# Patient Record
Sex: Male | Born: 1990
Health system: Southern US, Community
[De-identification: ages and names within clinical notes are randomized; demographics above are authoritative.]

## PROBLEM LIST (undated history)

## (undated) DIAGNOSIS — J45909 Unspecified asthma, uncomplicated: Secondary | ICD-10-CM

## (undated) DIAGNOSIS — E669 Obesity, unspecified: Secondary | ICD-10-CM

## (undated) DIAGNOSIS — I10 Essential (primary) hypertension: Secondary | ICD-10-CM

## (undated) DIAGNOSIS — N529 Male erectile dysfunction, unspecified: Secondary | ICD-10-CM

## (undated) DIAGNOSIS — I517 Cardiomegaly: Secondary | ICD-10-CM

## (undated) HISTORY — DX: Essential (primary) hypertension: I10

## (undated) HISTORY — PX: PENILE DEBRIDEMENT: SHX2200

## (undated) HISTORY — DX: Obesity, unspecified: E66.9

## (undated) HISTORY — DX: Cardiomegaly: I51.7

## (undated) HISTORY — DX: Male erectile dysfunction, unspecified: N52.9

---
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
## 2011-08-15 LAB — STREP A DNA PROBE: Group A Strep Probe: NEGATIVE

## 2011-08-15 LAB — RAPID STREP SCREEN (MED CTR MEBANE ONLY): Streptococcus, Group A Screen (Direct): NEGATIVE

## 2013-09-27 ENCOUNTER — Ambulatory Visit: Payer: Self-pay | Admitting: Family Medicine

## 2015-10-28 ENCOUNTER — Encounter (HOSPITAL_COMMUNITY): Payer: Self-pay | Admitting: Emergency Medicine

## 2015-10-28 ENCOUNTER — Emergency Department (HOSPITAL_COMMUNITY): Payer: Self-pay

## 2015-10-28 ENCOUNTER — Emergency Department (HOSPITAL_COMMUNITY)
Admission: EM | Admit: 2015-10-28 | Discharge: 2015-10-28 | Disposition: A | Discharge status: Home / Self Care | Payer: Self-pay | Attending: Emergency Medicine | Admitting: Emergency Medicine

## 2015-10-28 ENCOUNTER — Emergency Department (HOSPITAL_COMMUNITY): Admission: EM | Admit: 2015-10-28 | Discharge: 2015-10-28 | Discharge status: Absent without leave | Payer: Self-pay

## 2015-10-28 DIAGNOSIS — J45909 Unspecified asthma, uncomplicated: Secondary | ICD-10-CM | POA: Insufficient documentation

## 2015-10-28 DIAGNOSIS — R0789 Other chest pain: Secondary | ICD-10-CM | POA: Insufficient documentation

## 2015-10-28 HISTORY — DX: Unspecified asthma, uncomplicated: J45.909

## 2015-10-28 MED ORDER — IBUPROFEN 800 MG PO TABS
800.0000 mg | ORAL_TABLET | Freq: Once | ORAL | Status: AC
  Administered 2015-10-28: 800 mg via ORAL
  Filled 2015-10-28: qty 1

## 2015-10-28 MED ORDER — IBUPROFEN 800 MG PO TABS: 800.0000 mg | ORAL_TABLET | Freq: Three times a day (TID) | ORAL | Status: DC

## 2015-10-28 NOTE — ED Provider Notes (Signed)
CSN: [id number]     Arrival date & time 10/28/15  1900 History   First MD Initiated Contact with Patient 10/28/15 1909     Chief Complaint  Patient presents with  . Chest Pain     (Consider location/radiation/quality/duration/timing/severity/associated sxs/prior Treatment) HPI Comments: The pt is a 24 y/o male No PMH' No meds Hx of 60 lb weight loss over the last year with exercise - daily running on treadmill Today while sitting at rest had sharp stabbing CP on the mid and L side of the Chest - is constdant, worse with movement but not exertion / position or deep breathing - has no cough / fever or other c/o - no swelling of legs, travel, trauma or immob.  No PE or ACS rf's.  Mild sx at this time - gradually improving.  Patient is a 24 y.o. male presenting with chest pain. The history is provided by the patient.  Chest Pain   Past Medical History  Diagnosis Date  . Asthma    History reviewed. No pertinent past surgical history. History reviewed. No pertinent family history. Social History  Substance Use Topics  . Smoking status: Never Smoker   . Smokeless tobacco: None  . Alcohol Use: No    Review of Systems  Cardiovascular: Positive for chest pain.  All other systems reviewed and are negative.     Allergies  Review of patient's allergies indicates no known allergies.  Home Medications   Prior to Admission medications   Medication Sig Start Date End Date Taking? Authorizing Provider  ibuprofen (ADVIL,MOTRIN) 800 MG tablet Take 1 tablet (800 mg total) by mouth 3 (three) times daily. 10/28/15   Eber Hong, MD   BP 133/84 mmHg  Pulse 70  Temp(Src) 98.1 F (36.7 C) (Oral)  Resp 20  Ht  (1.753 m)  Wt 220 lb (99.791 kg)  BMI 32.47 kg/m2  SpO2 100% Physical Exam  Constitutional: He appears well-developed and well-nourished. No distress.  HENT:  Head: Normocephalic and atraumatic.  Mouth/Throat: Oropharynx is clear and moist. No oropharyngeal exudate.   Eyes: Conjunctivae and EOM are normal. Pupils are equal, round, and reactive to light. Right eye exhibits no discharge. Left eye exhibits no discharge. No scleral icterus.  Neck: Normal range of motion. Neck supple. No JVD present. No thyromegaly present.  Cardiovascular: Normal rate, regular rhythm, normal heart sounds and intact distal pulses.  Exam reveals no gallop and no friction rub.   No murmur heard. Pulmonary/Chest: Effort normal and breath sounds normal. No respiratory distress. He has no wheezes. He has no rales. He exhibits no tenderness.  Abdominal: Soft. Bowel sounds are normal. He exhibits no distension and no mass. There is no tenderness.  Musculoskeletal: Normal range of motion. He exhibits no edema or tenderness.  Lymphadenopathy:    He has no cervical adenopathy.  Neurological: He is alert. Coordination normal.  Skin: Skin is warm and dry. No rash noted. No erythema.  Psychiatric: He has a normal mood and affect. His behavior is normal.  Nursing note and vitals reviewed.   ED Course  Procedures (including critical care time) Labs Review Labs Reviewed - No data to display  Imaging Review No results found. I have personally reviewed and evaluated these images and lab results as part of my medical decision-making.   EKG Interpretation   Date/Time:  Wednesday October 28 2015 19:09:11 EST Ventricular Rate:  71 PR Interval:  152 QRS Duration: 121 QT Interval:  428 QTC Calculation: 465 R  Axis:   -37 Text Interpretation:  Sinus rhythm Nonspecific IVCD with LAD ST elev,  probable normal early repol pattern No old tracing to compare Confirmed by  Jeferson Boozer  MD, Tennie Grussing ([id number]) on 10/28/2015 7:22:50 PM      MDM   Final diagnoses:  Chest wall pain    ECG unremarkable - CXR pending - low risk ACS or PE - liekly pleurisy - motrin,  Pt refuses CXR - leaving AMA - pt informed of need for xray to r/o other pathology =- states he doesn't have money for xray and refuses-   Well appaering.  Meds given in ED:  Medications - No data to display  New Prescriptions   IBUPROFEN (ADVIL,MOTRIN) 800 MG TABLET    Take 1 tablet (800 mg total) by mouth 3 (three) times daily.        Eber HongBrian Caulder Wehner, MD 10/28/15 2027

## 2015-10-28 NOTE — Discharge Instructions (Signed)

## 2015-10-28 NOTE — ED Notes (Signed)
Pt states his pain is better and wants to leave. Pt does not want to stay. I tried to talk pt into staying but he denied.

## 2015-10-28 NOTE — ED Notes (Signed)
Patient complaining of central chest pain. States started approximately an hour and a half ago. Also reports shortness of breath. Denies other symptoms. Patient states he has been working out a lot recently.

## 2016-06-28 ENCOUNTER — Ambulatory Visit (INDEPENDENT_AMBULATORY_CARE_PROVIDER_SITE_OTHER): Payer: BLUE CROSS/BLUE SHIELD | Admitting: Urology

## 2016-06-28 DIAGNOSIS — N529 Male erectile dysfunction, unspecified: Secondary | ICD-10-CM

## 2016-07-20 ENCOUNTER — Ambulatory Visit (INDEPENDENT_AMBULATORY_CARE_PROVIDER_SITE_OTHER): Payer: BLUE CROSS/BLUE SHIELD | Admitting: Urology

## 2016-07-20 ENCOUNTER — Ambulatory Visit: Payer: BLUE CROSS/BLUE SHIELD | Admitting: Urology

## 2016-07-20 DIAGNOSIS — N5201 Erectile dysfunction due to arterial insufficiency: Secondary | ICD-10-CM | POA: Diagnosis not present

## 2016-07-20 DIAGNOSIS — N529 Male erectile dysfunction, unspecified: Secondary | ICD-10-CM

## 2016-08-31 ENCOUNTER — Ambulatory Visit (INDEPENDENT_AMBULATORY_CARE_PROVIDER_SITE_OTHER): Payer: BLUE CROSS/BLUE SHIELD | Admitting: Urology

## 2016-08-31 DIAGNOSIS — N529 Male erectile dysfunction, unspecified: Secondary | ICD-10-CM

## 2016-08-31 DIAGNOSIS — N5201 Erectile dysfunction due to arterial insufficiency: Secondary | ICD-10-CM | POA: Diagnosis not present

## 2016-11-30 ENCOUNTER — Ambulatory Visit: Payer: BLUE CROSS/BLUE SHIELD | Admitting: Urology

## 2018-07-26 DIAGNOSIS — J019 Acute sinusitis, unspecified: Secondary | ICD-10-CM | POA: Diagnosis not present

## 2018-07-26 DIAGNOSIS — R5383 Other fatigue: Secondary | ICD-10-CM | POA: Diagnosis not present

## 2018-08-15 DIAGNOSIS — J019 Acute sinusitis, unspecified: Secondary | ICD-10-CM | POA: Diagnosis not present

## 2018-08-15 DIAGNOSIS — R030 Elevated blood-pressure reading, without diagnosis of hypertension: Secondary | ICD-10-CM | POA: Diagnosis not present

## 2018-09-14 DIAGNOSIS — N529 Male erectile dysfunction, unspecified: Secondary | ICD-10-CM | POA: Diagnosis not present

## 2018-09-14 DIAGNOSIS — I10 Essential (primary) hypertension: Secondary | ICD-10-CM

## 2018-09-14 DIAGNOSIS — E669 Obesity, unspecified: Secondary | ICD-10-CM | POA: Diagnosis not present

## 2018-09-14 DIAGNOSIS — R740 Nonspecific elevation of levels of transaminase and lactic acid dehydrogenase [LDH]: Secondary | ICD-10-CM | POA: Diagnosis not present

## 2018-09-14 DIAGNOSIS — I160 Hypertensive urgency: Secondary | ICD-10-CM | POA: Diagnosis not present

## 2018-09-14 DIAGNOSIS — R079 Chest pain, unspecified: Secondary | ICD-10-CM | POA: Diagnosis not present

## 2018-09-14 DIAGNOSIS — I517 Cardiomegaly: Secondary | ICD-10-CM | POA: Diagnosis not present

## 2018-09-14 HISTORY — DX: Essential (primary) hypertension: I10

## 2018-09-15 DIAGNOSIS — I160 Hypertensive urgency: Secondary | ICD-10-CM | POA: Diagnosis not present

## 2018-09-15 DIAGNOSIS — Z634 Disappearance and death of family member: Secondary | ICD-10-CM | POA: Diagnosis not present

## 2018-09-16 DIAGNOSIS — R079 Chest pain, unspecified: Secondary | ICD-10-CM | POA: Diagnosis not present

## 2018-09-19 ENCOUNTER — Ambulatory Visit: Payer: 59 | Admitting: Medical

## 2018-09-19 ENCOUNTER — Encounter: Payer: Self-pay | Admitting: Medical

## 2018-09-19 VITALS — BP 140/94 | HR 82 | Temp 98.1°F | Resp 16 | Ht 68.5 in | Wt 259.4 lb

## 2018-09-19 DIAGNOSIS — R7989 Other specified abnormal findings of blood chemistry: Secondary | ICD-10-CM | POA: Diagnosis not present

## 2018-09-19 DIAGNOSIS — F419 Anxiety disorder, unspecified: Secondary | ICD-10-CM | POA: Diagnosis not present

## 2018-09-19 DIAGNOSIS — I517 Cardiomegaly: Secondary | ICD-10-CM

## 2018-09-19 DIAGNOSIS — E669 Obesity, unspecified: Secondary | ICD-10-CM

## 2018-09-19 DIAGNOSIS — Z6838 Body mass index (BMI) 38.0-38.9, adult: Secondary | ICD-10-CM | POA: Insufficient documentation

## 2018-09-19 DIAGNOSIS — I10 Essential (primary) hypertension: Secondary | ICD-10-CM | POA: Diagnosis not present

## 2018-09-19 DIAGNOSIS — R778 Other specified abnormalities of plasma proteins: Secondary | ICD-10-CM

## 2018-09-19 DIAGNOSIS — R079 Chest pain, unspecified: Secondary | ICD-10-CM | POA: Insufficient documentation

## 2018-09-19 DIAGNOSIS — I119 Hypertensive heart disease without heart failure: Secondary | ICD-10-CM | POA: Insufficient documentation

## 2018-09-19 MED ORDER — CITALOPRAM HYDROBROMIDE 10 MG PO TABS: 10.0000 mg | ORAL_TABLET | Freq: Every day | ORAL | 1 refills | Status: DC

## 2018-09-19 NOTE — Progress Notes (Signed)
Subjective: Chief Complaint  Patient presents with  . NP    NP hospital fu chest pain    Here as new patient for hospital f/u.   Went 09/14/18 to [facility] in [place], [place] Friday for chest pain.   Was kept overnight for observation.     Was at work this past Friday 6 days ago, had chest pain that wouldn't resolve.  He drove himself to the ED and was evaluated.  He reports that blood levels were elevated and there was some other abnormalities noted.  He denies associated SOB, sweats or nausea during the chest pain episode.   The chest pain lasted an hour or more that day.     I reviewed hospital records in care everywhere from [facility]. Comprehensive metabolic panel normal, serial troponin's were elevated on the second third test, 0.033. Urinalysis normal Urine drug screen negative Blood count was minimal abnormalities not likely significant Chest x-ray showed cardiomegaly  He was started on amlodipine 5 mg daily, chlorthalidone 25 mg daily  Lives alone.  He is aware that he snores.  No hx/o reported apnea.   Father had sleep apnea.   Sometimes awakes rested.   Does get sleepy sometimes during the day.  He reports that he exercises regularly roughly 30 minutes most days a week on the treadmill or elliptical machine, does some weight training.  He denies symptoms during exercise.  He notes considerable anxiety and worry.  His father passed away right at a year ago unexpectedly from a brain infection.  His father had diabetes.  His mother has high blood pressure.  He is not aware of any heart disease or stroke in the family otherwise.  He would like to go back on some medicine to help with anxiety.  He has been on some medication in the remote past for this.  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   Past Medical History:  Diagnosis Date  . Asthma    Current Outpatient Medications on File Prior to Visit   Medication Sig Dispense Refill  . amLODipine (NORVASC) 5 MG tablet Take 5 mg by mouth daily.    . chlorthalidone (HYGROTON) 25 MG tablet Take 25 mg by mouth daily.     No current facility-administered medications on file prior to visit.    ROS as in subjective'   Objective: BP (!) 140/94   Pulse 82   Temp 98.1 F (36.7 C) (Oral)   Resp 16   Ht 5' 8.5" (1.74 m)   Wt 259 lb 6.4 oz (117.7 kg)   SpO2 98%   BMI 38.87 kg/m   General appearance: alert, no distress, WD/WN, obese AA male HEENT: normocephalic, sclerae anicteric, TMs pearly, nares patent, no discharge or erythema, pharynx normal Oral cavity: MMM, no lesions Neck: supple, no lymphadenopathy, no thyromegaly, no masses, no JVD Heart: RRR, normal S1, S2, no murmurs Lungs: CTA bilaterally, no wheezes, rhonchi, or rales Ext: no edema Pulses: 2+ symmetric, upper and lower extremities, normal cap refill    Assessment: Encounter Diagnoses  Name Primary?  . Essential hypertension, benign Yes  . Elevated troponin   . Cardiomegaly   . Anxiety   . Obesity with serious comorbidity, unspecified classification, unspecified obesity type   . Chest pain, unspecified type      Plan: Hypertension-new diagnosis last week, continue current 2 medications amlodipine and chlorthalidone.  Chest pain-I reviewed his care everywhere emergency department notes, labs, chest  x-ray and EKG.  Given the abnormal troponin and chest x-ray findings we will refer to cardiology.  Advised he only walk and no other strenuous exercise including weight training for the time being.  Discussed symptoms and signs that would prompt a call to 911.  Obesity-counseled on making some changes with lifestyle particular diet to help with weight loss gradually  Anxiety- advised counseling, advised to surround himself with people he can encourage him and be listening year.  He wants to begin some medicine to help so we will start on low-dose citalopram once daily  at bedtime.  Discussed risk and benefits of medication.  Follow-up in 3 weeks, sooner as needed   Damante was seen today for np.  Diagnoses and all orders for this visit:  Essential hypertension, benign -     Ambulatory referral to Cardiology  Elevated troponin -     Ambulatory referral to Cardiology  Cardiomegaly -     Ambulatory referral to Cardiology  Anxiety  Obesity with serious comorbidity, unspecified classification, unspecified obesity type  Chest pain, unspecified type  Other orders -     citalopram (CELEXA) 10 MG tablet; Take 1 tablet (10 mg total) by mouth daily.

## 2018-09-19 NOTE — Patient Instructions (Signed)
It was nice to meet you.  We look forward to working with you on your healthcare concerns  Recommendations  I am referring you to cardiology hopefully within the next 7 days for additional evaluation given your findings at the emergency department  Continue the 2 new blood pressure medications amlodipine and chlorthalidone once daily in the morning  Begin citalopram/Celexa to help with anxiety and mood  Consider counseling  Until you see the cardiologist do not do any heavy lifting or weight training, keep your cardiac activity to walking and no strenuous activity for now  Consider diet information below  I recommend a healthy diet.    Do's:   whole grains such as whole grain pasta, rice, whole grains breads and whole grain cereals.  Use small quantities such as 1/2 cup per serving or 2 slices of bread per serving.    Eat 3-5 fruits daily  Eat beans at least once daily  Eat almonds in small quantities at least 3 days per week    If they eat meat, I recommend small portions of lean meats such as chicken, fish, and [place].  Eat as much NON corn and NON potato vegetables as they like, particularly raw or steamed  Drink several large glasses of water daily  Cautions:  Limit red meat  Limit corn and potatoes  Limit sweets, cake, pie, candy  Limit beer and alcohol  Avoid fried food, fast food, large portions  Avoid sugary drinks such as regular soda and sweet tea    RESOURCES in [place], [place]  If you are experiencing a mental health crisis or an emergency, please call 911 or go to the nearest emergency department.  [facility]   [phone] [facility]  [phone] [facility]   [phone]  Suicide Hotline 1-800-Suicide 909-211-9146)  National Suicide Prevention Lifeline (762) 133-7822  202 051 3242)  Domestic Violence, Rape/Crisis - [facility] [place] 932-355-7322  [employer] Violence Hotline  1-800-799-SAFE (949)430-4477)  To report Child or Elder Abuse, please call: [facility] Police Department  [phone] [facility] Department  [phone]  [facility]  Crisis Line [phone]  Teen Crisis line [phone] or [phone]     Psychiatry and Counseling services  [facility] [address], [place], [place] [address] [phone]  Stevphen Meuse, therapist Dr. Meredith Staggers, psychiatrist Dr. Beverly Milch, child psychiatrist   Dr. Len Blalock [address], [place], [place] [address] [phone]   Dr. Milagros Evener, psychiatry [address] [place], [place] [address] [phone]   [facility] [address] [place], [place] [address] [phone]   [facility] [address] [place], [place], [place] [address] [phone]    Counseling Services (NON- psychiatrist offices)  [facility] [address] [place], [place] [address] [phone]   [facility] [phone] [address], [place], [place] [address]   [facility] [phone]  [email or web address] [address] [place], [place], [place] [address]   Lenise Arena. Charlyne Mom, therapist [phone] [address] [place], [place] [address]   [facility] [phone] [address] [place], [place] [address]   Glade Lloyd, therapist [phone] [address], [place], [place] [address]   [facility] [phone] [address] [place], [place], [place] [address]

## 2018-10-02 ENCOUNTER — Telehealth: Payer: Self-pay | Admitting: Medical

## 2018-10-02 NOTE — Telephone Encounter (Signed)
Please call   He has not heard from cardiology referral

## 2018-10-03 NOTE — Telephone Encounter (Signed)
Pt was notified and was given number to Dr. Jacinto HalimGanji to call to schedule

## 2018-10-08 ENCOUNTER — Encounter: Payer: Self-pay | Admitting: Medical

## 2018-10-08 ENCOUNTER — Ambulatory Visit: Payer: 59 | Admitting: Medical

## 2018-10-08 VITALS — BP 130/74 | HR 73 | Temp 98.7°F | Resp 16 | Ht 69.0 in | Wt 262.0 lb

## 2018-10-08 DIAGNOSIS — I517 Cardiomegaly: Secondary | ICD-10-CM | POA: Diagnosis not present

## 2018-10-08 DIAGNOSIS — F419 Anxiety disorder, unspecified: Secondary | ICD-10-CM | POA: Diagnosis not present

## 2018-10-08 DIAGNOSIS — E669 Obesity, unspecified: Secondary | ICD-10-CM

## 2018-10-08 DIAGNOSIS — I10 Essential (primary) hypertension: Secondary | ICD-10-CM | POA: Diagnosis not present

## 2018-10-08 DIAGNOSIS — R7989 Other specified abnormal findings of blood chemistry: Secondary | ICD-10-CM

## 2018-10-08 DIAGNOSIS — R778 Other specified abnormalities of plasma proteins: Secondary | ICD-10-CM

## 2018-10-08 MED ORDER — AMLODIPINE BESYLATE 5 MG PO TABS: 5.0000 mg | ORAL_TABLET | Freq: Every day | ORAL | 2 refills | Status: DC

## 2018-10-08 MED ORDER — CITALOPRAM HYDROBROMIDE 20 MG PO TABS: 20.0000 mg | ORAL_TABLET | Freq: Every day | ORAL | 2 refills | Status: DC

## 2018-10-08 MED ORDER — CHLORTHALIDONE 25 MG PO TABS: 25.0000 mg | ORAL_TABLET | Freq: Every day | ORAL | 2 refills | Status: DC

## 2018-10-08 NOTE — Progress Notes (Signed)
Subjective: Chief Complaint  Patient presents with  . follow up    follow up HTN  cardiology next monday   Here for follow-up on hypertension, last visit new patient visit  Last visit we advised to continue amlodipine 5 mg daily and chlorthalidone 25 mg daily started by the emergency department in [facility] [place].  He was seen on 09/14/2018 for chest pain and elevated troponins.  We referred him to cardiology and he has his appointment a week from today.Marland Kitchen. No cramping, no side effects reported. He has been walking for exercise and elliptical.   Has main some small changes in diet since last visit.  He knows there are areas he can improve on.  He is compliant with the 2 blood pressure medications.  Last visit we also started him on Celexa to help with anxiety.  He feels that the Celexa is helping some.  No side effects.  Taking it at night.  Last visit he noted general problems with anxiety including stress with father's passing away this past year.   Stays busy on the weekends, and busy with work during the week.   Working on [employer].  Worries about mother's legal troubles.   She has drinking problem.  Lives alone.  No current significant other.   Has seen counselor in the past.   Since last visit has had some slight chest pains, not like what prompted his initial ED visit 09/14/18.  Past Medical History:  Diagnosis Date  . Asthma   . Hypertension 09/2018    Family History  Problem Relation Age of Onset  . Hypertension Mother   . Diabetes Father   . Other Father        died of brain infection  . Heart disease Neg Hx   . Stroke Neg Hx    ROS as in subjective   Objective: BP 130/74   Pulse 73   Temp 98.7 F (37.1 C) (Oral)   Resp 16   Ht 5\' 9"  (1.753 m)   Wt 262 lb (118.8 kg)   SpO2 97%   BMI 38.69 kg/m   Wt Readings from Last 3 Encounters:  10/08/18 262 lb (118.8 kg)  09/19/18 259 lb 6.4 oz (117.7 kg)  10/28/15 220 lb (99.8 kg)   BP Readings from Last 3  Encounters:  10/08/18 130/74  09/19/18 (!) 140/94  10/28/15 133/84   Gen: wd, wn, nad Lungs clear Heart rrr, normal s1, s2, no murmurs Pulses normal Psych: pleasant, good eye contact, answers questions appropriately     Assessment: Encounter Diagnoses  Name Primary?  . Essential hypertension, benign Yes  . Cardiomegaly   . Anxiety   . Elevated troponin   . Obesity with serious comorbidity, unspecified classification, unspecified obesity type       Plan: Hypertension, elevated troponin, cardiomegaly on chest x-ray -improved, continue amlodipine and chlorthalidone.  He sees cardiology for new consult next week.  We will hold off on basic metabolic lab to recheck potassium and defer to them next week.  Anxiety-increase citalopram to 20 mg, recommended counseling, recommend he get in a men's group through his church.     Adam Burns was seen today for follow up.  Diagnoses and all orders for this visit:  Essential hypertension, benign  Cardiomegaly  Anxiety  Elevated troponin  Obesity with serious comorbidity, unspecified classification, unspecified obesity type  Other orders -     citalopram (CELEXA) 20 MG tablet; Take 1 tablet (20 mg total) by mouth daily. -  chlorthalidone (HYGROTON) 25 MG tablet; Take 1 tablet (25 mg total) by mouth daily. -     amLODipine (NORVASC) 5 MG tablet; Take 1 tablet (5 mg total) by mouth daily.

## 2018-10-15 DIAGNOSIS — E669 Obesity, unspecified: Secondary | ICD-10-CM | POA: Diagnosis not present

## 2018-10-15 DIAGNOSIS — I10 Essential (primary) hypertension: Secondary | ICD-10-CM | POA: Diagnosis not present

## 2018-10-15 DIAGNOSIS — I517 Cardiomegaly: Secondary | ICD-10-CM | POA: Diagnosis not present

## 2018-10-25 DIAGNOSIS — I10 Essential (primary) hypertension: Secondary | ICD-10-CM | POA: Diagnosis not present

## 2018-10-25 DIAGNOSIS — E669 Obesity, unspecified: Secondary | ICD-10-CM | POA: Diagnosis not present

## 2018-11-02 DIAGNOSIS — I119 Hypertensive heart disease without heart failure: Secondary | ICD-10-CM | POA: Diagnosis not present

## 2018-11-02 DIAGNOSIS — Z0189 Encounter for other specified special examinations: Secondary | ICD-10-CM | POA: Diagnosis not present

## 2018-11-02 DIAGNOSIS — E669 Obesity, unspecified: Secondary | ICD-10-CM | POA: Diagnosis not present

## 2018-11-06 ENCOUNTER — Encounter: Payer: Self-pay | Admitting: Medical

## 2018-11-23 DIAGNOSIS — J40 Bronchitis, not specified as acute or chronic: Secondary | ICD-10-CM | POA: Diagnosis not present

## 2018-11-23 DIAGNOSIS — J329 Chronic sinusitis, unspecified: Secondary | ICD-10-CM | POA: Diagnosis not present

## 2019-01-09 ENCOUNTER — Ambulatory Visit: Payer: 59 | Admitting: Medical

## 2019-01-09 ENCOUNTER — Encounter: Payer: Self-pay | Admitting: Medical

## 2019-01-09 VITALS — BP 130/80 | HR 64 | Temp 98.4°F | Resp 16 | Ht 69.0 in | Wt 261.8 lb

## 2019-01-09 DIAGNOSIS — E669 Obesity, unspecified: Secondary | ICD-10-CM | POA: Diagnosis not present

## 2019-01-09 DIAGNOSIS — R079 Chest pain, unspecified: Secondary | ICD-10-CM | POA: Diagnosis not present

## 2019-01-09 DIAGNOSIS — I517 Cardiomegaly: Secondary | ICD-10-CM | POA: Diagnosis not present

## 2019-01-09 DIAGNOSIS — I10 Essential (primary) hypertension: Secondary | ICD-10-CM | POA: Diagnosis not present

## 2019-01-09 DIAGNOSIS — K219 Gastro-esophageal reflux disease without esophagitis: Secondary | ICD-10-CM | POA: Insufficient documentation

## 2019-01-09 MED ORDER — OMEPRAZOLE 40 MG PO CPDR: 40.0000 mg | DELAYED_RELEASE_CAPSULE | Freq: Every day | ORAL | 1 refills | Status: DC

## 2019-01-09 MED ORDER — VALSARTAN-HYDROCHLOROTHIAZIDE 320-25 MG PO TABS: 1.0000 | ORAL_TABLET | Freq: Every day | ORAL | 3 refills | Status: DC

## 2019-01-09 NOTE — Patient Instructions (Signed)
Gastroesophageal Reflux Disease, Adult   Gastroesophageal reflux disease (GERD) happens when acid from your stomach flows up into the esophagus. When acid comes in contact with the esophagus, the acid causes soreness (inflammation) in the esophagus. Over time, GERD may create small holes (ulcers) in the lining of the esophagus.  CAUSES   Increased body weight. This puts pressure on the stomach, making acid rise from the stomach into the esophagus.   Smoking. This increases acid production in the stomach.   Drinking alcohol. This causes decreased pressure in the lower esophageal sphincter (valve or ring of muscle between the esophagus and stomach), allowing acid from the stomach into the esophagus.   Late evening meals and a full stomach. This increases pressure and acid production in the stomach.   A malformed lower esophageal sphincter.  Sometimes, no cause is found.  SYMPTOMS   Burning pain in the lower part of the mid-chest behind the breastbone and in the mid-stomach area. This may occur twice a week or more often.   Trouble swallowing.   Sore throat.   Dry cough.   Asthma-like symptoms including chest tightness, shortness of breath, or wheezing.   DIAGNOSIS  Your caregiver may be able to diagnose GERD based on your symptoms. In some cases, X-rays and other tests may be done to check for complications or to check the condition of your stomach and esophagus.   HOME CARE INSTRUCTIONS   Change the factors that you can control. Ask your caregiver for guidance concerning weight loss, quitting smoking, and alcohol consumption.   Avoid foods and drinks that make your symptoms worse, such as:   Caffeine or alcoholic drinks.   Chocolate.   Peppermint or mint flavorings.   Garlic and onions.   Spicy foods.   Citrus fruits, such as oranges, lemons, or limes.   Tomato-based foods such as sauce, chili, salsa, and pizza.   Fried and fatty foods.   Avoid lying down for  the 3 hours prior to your bedtime or prior to taking a nap.   Eat small, frequent meals instead of large meals.   Wear loose-fitting clothing. Do not wear anything tight around your waist that causes pressure on your stomach.   Raise the head of your bed 6 to 8 inches with wood blocks to help you sleep. Extra pillows will not help.   Only take over-the-counter or prescription medicines for pain, discomfort, or fever as directed by your caregiver.   Do not take aspirin, ibuprofen, or other nonsteroidal anti-inflammatory drugs (NSAIDs).   SEEK IMMEDIATE MEDICAL CARE IF:   You have pain in your arms, neck, jaw, teeth, or back.   Your pain increases or changes in intensity or duration.   You develop nausea, vomiting, or sweating (diaphoresis).   You develop shortness of breath, or you faint.   Your vomit is green, yellow, black, or looks like coffee grounds or blood.   Your stool is red, bloody, or black.  These symptoms could be signs of other problems, such as heart disease, gastric bleeding, or esophageal bleeding. MAKE SURE YOU:   Understand these instructions.   Will watch your condition.   Will get help right away if you are not doing well or get worse.  Document Released: 08/10/2005 Document Revised: 07/13/2011 Document Reviewed: 05/20/2011 [facility] Patient Information 2012 [place], [place].      Lets change strategies and try something that may work better than what you are currently doing  Breakfast You may eat 1 of  the following  Smoothie with Almond milk, handful of kale or spinach, and 1-2 fruit servings of your choice such as berries or 1/2 banana  Whole grain slice of toast and thin layer of low sugar jam or small amount of honey  Whole grain slice of toast and avocado spread  1/2 cup of steel cut oats (oatmeal)   Mid-morning snack 1 fruit serving such as one of the following:  medium-sized apple  medium-sized orange,  Tangerine  1/2 banana    3/4 cup of fresh berries or frozen berries  A protein source such as one of the following:  8 almonds   small handful of walnuts or other nuts  Hummus and vegetable such as carrots   Lunch A protein source such as 1 of the following: . 1 serving of beans such as black beans, pinto beans, green beans, or edamame (soy beans) . Veggie burger  . Non breaded fish such as salmon or tuna, either baked, grilled, or broiled Vegetable - Half of your plate should be a non-starchy vegetables!  So avoid white potatoes and corn.  Otherwise, eat a large portion of vegetables. . Avocado, cucumber, tomato, carrots, greens, lettuce, squash, okra, etc.  . Vegetables can include salad with olive oil/vinaigrette dressing Grains such as 1/2 cup of brown rice, quinoa, barley or other whole grain or 1 or 2 slices of whole grain bread   Mid-afternoon snack 1 fruit serving such as one of the following:  medium-sized apple  medium-sized orange,  Tangerine  1/2 banana   3/4 cup of fresh berries or frozen berries  A protein source such as one of the following:  8 almonds   small handful of walnuts or other nuts  Hummus and vegetable such as carrots   Dinner A protein source such as 1 of the following: . 1 serving of beans such as black beans, pinto beans, green beans, or edamame (soy beans) . Veggie burger  . Non breaded fish such as salmon or tuna, either baked, grilled, or broiled Vegetable - Half of your plate should be a non-starchy vegetables!  So avoid white potatoes and corn.  Otherwise, eat a large portion of vegetables. . Avocado, cucumber, tomato, carrots, greens, lettuce, squash, okra, etc.  . Vegetables can include salad with olive oil/vinaigrette dressing Grains such as 1/2 cup of brown rice, quinoa, barley or other whole grain or 1 or 2 slices of whole grain bread   Beverages: Water Unsweet tea Home made juice with a juicer without sugar added other than small bit of  honey or agave nectar Water with sugar free flavor such as [place]   AVOID.... For the time being I want you to cut out the following items completely: . Soda, sweet tea, juice, beer or wine or alcohol . Sweets such as cake, candy, pies, chips, cookies, chocolate

## 2019-01-09 NOTE — Progress Notes (Signed)
Subjective: Chief Complaint  Patient presents with  . follow up    follow up chest pain   Here for chest pains again.  I saw him a few months ago referred him to cardiology given abnormal findings from hospital visit for chest pain.  His blood pressure medicine has been changed.  He currently notes that he is on valsartan HCT and not amlodipine and chlorthalidone.  His chest pains have been random.  2 nights ago had some chest discomfort that lasted 1.5 to 2 hours.  It occurred after he ate some [employer] chicken.  He does endorse eating spicy and acidic foods, drinks soda regularly.  He has tried to make some improvements in diet and exercise since last visit here.  Denies sweats, SOB, dyspnea, wheezing.  He is exercising with walking, and denies chest pain with exercise.   No other aggravating or relieving factors. No other complaint.   Past Medical History:  Diagnosis Date  . Asthma   . Hypertension 09/2018   Current Outpatient Medications on File Prior to Visit  Medication Sig Dispense Refill  . amLODipine (NORVASC) 5 MG tablet Take 1 tablet (5 mg total) by mouth daily. (Patient not taking: Reported on 01/09/2019) 30 tablet 2   No current facility-administered medications on file prior to visit.    ROS as in subjective   Objective: BP 130/80   Pulse 64   Temp 98.4 F (36.9 C) (Oral)   Resp 16   Ht 5\' 9"  (1.753 m)   Wt 261 lb 12.8 oz (118.8 kg)   SpO2 98%   BMI 38.66 kg/m   Wt Readings from Last 3 Encounters:  01/09/19 261 lb 12.8 oz (118.8 kg)  10/08/18 262 lb (118.8 kg)  09/19/18 259 lb 6.4 oz (117.7 kg)   BP Readings from Last 3 Encounters:  01/09/19 130/80  10/08/18 130/74  09/19/18 (!) 140/94   General appearance: alert, no distress, WD/WN,  Neck: supple, no lymphadenopathy, no thyromegaly, no masses Heart: RRR, normal S1, S2, no murmurs Lungs: CTA bilaterally, no wheezes, rhonchi, or rales Chest wall nontender, no deformity, normal I:E Abdomen: +bs, soft, non  tender, non distended, no masses, no hepatomegaly, no splenomegaly Pulses: 2+ symmetric, upper and lower extremities, normal cap refill Ext: no edema   Assessment: Encounter Diagnoses  Name Primary?  . Chest pain, unspecified type Yes  . LVH (left ventricular hypertrophy)   . Cardiomegaly   . Essential hypertension, benign   . Obesity with serious comorbidity, unspecified classification, unspecified obesity type   . Gastroesophageal reflux disease, esophagitis presence not specified      Plan: I reviewed his cardiology notes from visit in December and echocardiogram showing severe LVH secondary to HTN and obesity.     His symptoms today suggest GERD.  Begin Omeprazole, avoid GERD trigger foods.    counseled on exercise.  Avoid weight lifting/body building, but focus more on gradual increase in aerobic exercise  counseled on diet, advised pescetarian diet.     F/u 2wk with call back or recheck.  otherwise f/u with cardiology as planned in 02/2019.  Adam Burns was seen today for follow up.  Diagnoses and all orders for this visit:  Chest pain, unspecified type  LVH (left ventricular hypertrophy)  Cardiomegaly  Essential hypertension, benign  Obesity with serious comorbidity, unspecified classification, unspecified obesity type  Gastroesophageal reflux disease, esophagitis presence not specified  Other orders -     omeprazole (PRILOSEC) 40 MG capsule; Take 1 capsule (40 mg total)  by mouth daily. -     valsartan-hydrochlorothiazide (DIOVAN-HCT) 320-25 MG tablet; Take 1 tablet by mouth daily.

## 2019-03-01 NOTE — Progress Notes (Signed)
Subjective:  Primary Physician:  Carlena Hurl, PA-C  Patient ID: Adam Burns, male    DOB: 06/13/1991, 28 y.o.   MRN: [id number]  This visit type was conducted due to national recommendations for restrictions regarding the COVID-19 Pandemic (e.g. social distancing).  This format is felt to be most appropriate for this patient at this time.  All issues noted in this document were discussed and addressed.  No physical exam was performed (except for noted visual exam findings with Telehealth visits - very limited).  The patient has consented to conduct a Telehealth visit and understands insurance will be billed.   I connected with patient, on 03/04/19  by a telemedicine application and verified that I am speaking with the correct person using two identifiers.     I discussed the limitations of evaluation and management by telemedicine and the availability of in person appointments. The patient expressed understanding and agreed to proceed.   I have discussed with patient regarding the safety during COVID Pandemic and steps and precautions including social distancing with the patient.    Chief Complaint  Patient presents with  . Hypertension  . Follow-up    4 mth     HPI: Adam Burns  is a 28 y.o. male . Patient has Hypertension with heart dusease. He also has obesity, anxiety disorder.  Patient has been feeling well since the last visit.  He had acid reflux symptoms and is taking omeprazole.  Patient says that since then, he does not have any chest pain or chest discomfort.  He used to walk on the treadmill or did elliptical exercises at the gym. Currently, gym is closed because of "COVID virus problem.  He has been walking or running regularly.  No history of diabetes mellitus or high cholesterol.  He does not smoke. No family history of CAD. Mother has hypertension. No family history of sudden cardiac death.  Past Medical History:  Diagnosis Date  . Asthma   .  Cardiomegaly   . Hypertension 09/2018  . Obesity, unspecified     History reviewed. No pertinent surgical history.  Social History   Socioeconomic History  . Marital status: Single    Spouse name: Not on file  . Number of children: 0  . Years of education: Not on file  . Highest education level: Not on file  Occupational History  . Not on file  Social Needs  . Financial resource strain: Not on file  . Food insecurity:    Worry: Not on file    Inability: Not on file  . Transportation needs:    Medical: Not on file    Non-medical: Not on file  Tobacco Use  . Smoking status: Never Smoker  . Smokeless tobacco: Never Used  Substance and Sexual Activity  . Alcohol use: No  . Drug use: No  . Sexual activity: Not on file  Lifestyle  . Physical activity:    Days per week: Not on file    Minutes per session: Not on file  . Stress: Not on file  Relationships  . Social connections:    Talks on phone: Not on file    Gets together: Not on file    Attends religious service: Not on file    Active member of club or organization: Not on file    Attends meetings of clubs or organizations: Not on file    Relationship status: Not on file  . Intimate partner violence:  Fear of current or ex partner: Not on file    Emotionally abused: Not on file    Physically abused: Not on file    Forced sexual activity: Not on file  Other Topics Concern  . Not on file  Social History Narrative  . Not on file    Current Outpatient Medications on File Prior to Visit  Medication Sig Dispense Refill  . omeprazole (PRILOSEC) 40 MG capsule Take 1 capsule (40 mg total) by mouth daily. 30 capsule 1  . valsartan-hydrochlorothiazide (DIOVAN-HCT) 320-25 MG tablet Take 1 tablet by mouth daily. 90 tablet 3  . amLODipine (NORVASC) 5 MG tablet Take 1 tablet (5 mg total) by mouth daily. (Patient not taking: Reported on 01/09/2019) 30 tablet 2   No current facility-administered medications on file prior to  visit.     Review of Systems  Constitutional: Negative for fever.  HENT: Negative for nosebleeds.   Eyes: Negative for blurred vision.  Respiratory: Negative for cough.   Cardiovascular: Negative for palpitations, claudication and leg swelling.  Gastrointestinal: Negative for abdominal pain, nausea and vomiting.  Genitourinary: Negative for dysuria.  Musculoskeletal: Negative for myalgias.  Skin: Negative for itching and rash.  Neurological: Negative for dizziness, seizures and loss of consciousness.  Psychiatric/Behavioral: The patient is not nervous/anxious.        Objective:  Height 5' 9"  (1.753 m), weight 256 lb 1.6 oz (116.2 kg). Body mass index is 37.82 kg/m.  Physical Exam  Patient is alert and oriented.  He appeared comfortable while talking to me on telephone.  Patient had checked his blood pressure few times since his last office visit and said it had been normal.  No further physical examination was possible as it was a telemedicine visit.  CARDIAC STUDIES:  Echocardiogram 10/25/2018: Left ventricle cavity is normal in size. Severe concentric hypertrophy of the left ventricle. Normal global wall motion. Normal diastolic filling pattern. Calculated EF 56%. Left atrial cavity is moderately dilated. Mild (Grade I) mitral regurgitation. Mild tricuspid regurgitation. Estimated pulmonary artery systolic pressure 23 mmHg.  Assessment & Recommendations:  Hypertension with heart disease  Obesity with body mass index (BMI) of 30.0 to 39.9  Laboratory Exam: 10/26/2018: TSH 1.4. Creatinine 0.96, EGFR 107/124, potassium 4.1, BMP normal. 09/14/2018: RBC 4.62, hematocrit 42.7, CBC otherwise normal.  Lipid Panel:  10/26/2018: TSH 1.4. Cholesterol 125, triglycerides 74, HDL 44, LDL 66.  Recommendation: His blood pressure is controlled.  I have advised him to continue present medications.He did not have any  chest pain since the last visit and patient said his symptoms have  resolved with omeprazole therapy.  Primary prevention was again discussed with the patient. He was advised to follow low-salt, low-cholesterol diet. He was also advised to restrict calories to lose weight. The patient was advised to continue regular exercise.  He was advised to monitor blood pressure at home and call us if it stays high. Return for follow-up after 6 months.   Despina Hick, MD, [facility] 03/04/2019, 12:24 PM [facility]. [place] Pager: [phone] Office: [phone] If no answer Cell [phone]

## 2019-03-04 ENCOUNTER — Ambulatory Visit (INDEPENDENT_AMBULATORY_CARE_PROVIDER_SITE_OTHER): Payer: 59 | Admitting: Cardiology

## 2019-03-04 ENCOUNTER — Encounter: Payer: Self-pay | Admitting: Cardiology

## 2019-03-04 ENCOUNTER — Other Ambulatory Visit: Payer: Self-pay

## 2019-03-04 VITALS — Ht 69.0 in | Wt 256.1 lb

## 2019-03-04 DIAGNOSIS — E669 Obesity, unspecified: Secondary | ICD-10-CM

## 2019-03-04 DIAGNOSIS — I119 Hypertensive heart disease without heart failure: Secondary | ICD-10-CM | POA: Diagnosis not present

## 2019-03-07 ENCOUNTER — Other Ambulatory Visit: Payer: Self-pay | Admitting: Medical

## 2019-04-09 ENCOUNTER — Other Ambulatory Visit: Payer: Self-pay | Admitting: Cardiology

## 2019-05-16 ENCOUNTER — Telehealth: Payer: Self-pay

## 2019-05-16 ENCOUNTER — Other Ambulatory Visit: Payer: Self-pay

## 2019-05-16 MED ORDER — LISINOPRIL-HYDROCHLOROTHIAZIDE 20-12.5 MG PO TABS: 2.0000 | ORAL_TABLET | Freq: Every day | ORAL | 0 refills | Status: DC

## 2019-05-16 NOTE — Telephone Encounter (Signed)
IS IT BID BECAUSE IT DOESN'T GIVE ME AN OPTION TO SEND IT TWICE A DAY?

## 2019-05-16 NOTE — Telephone Encounter (Signed)
Pt called and said that his Valsartan/HCTZ in on backorder, is there an alternative?

## 2019-06-12 ENCOUNTER — Other Ambulatory Visit: Payer: Self-pay | Admitting: Cardiology

## 2019-06-27 ENCOUNTER — Other Ambulatory Visit: Payer: Self-pay | Admitting: Medical

## 2019-07-24 ENCOUNTER — Other Ambulatory Visit: Payer: Self-pay | Admitting: Cardiology

## 2019-08-25 ENCOUNTER — Other Ambulatory Visit: Payer: Self-pay | Admitting: Cardiology

## 2019-09-03 ENCOUNTER — Ambulatory Visit: Payer: 59 | Admitting: Cardiology

## 2019-09-25 ENCOUNTER — Other Ambulatory Visit: Payer: Self-pay | Admitting: Cardiology

## 2019-09-25 ENCOUNTER — Other Ambulatory Visit: Payer: Self-pay | Admitting: Medical

## 2019-10-31 ENCOUNTER — Other Ambulatory Visit: Payer: Self-pay

## 2019-10-31 DIAGNOSIS — I119 Hypertensive heart disease without heart failure: Secondary | ICD-10-CM

## 2019-10-31 MED ORDER — LISINOPRIL-HYDROCHLOROTHIAZIDE 20-12.5 MG PO TABS: 2.0000 | ORAL_TABLET | Freq: Every day | ORAL | 0 refills | Status: DC

## 2019-11-01 ENCOUNTER — Telehealth: Payer: Self-pay | Admitting: Medical

## 2019-11-01 NOTE — Telephone Encounter (Signed)
Schedule for physical fasting in the next 3 months

## 2019-11-04 NOTE — Telephone Encounter (Signed)
Called and left pt a Vm °

## 2019-12-13 ENCOUNTER — Encounter: Payer: Self-pay | Admitting: Medical

## 2019-12-13 ENCOUNTER — Other Ambulatory Visit: Payer: Self-pay | Admitting: Cardiology

## 2019-12-13 ENCOUNTER — Other Ambulatory Visit: Payer: Self-pay

## 2019-12-13 ENCOUNTER — Ambulatory Visit: Payer: 59 | Admitting: Medical

## 2019-12-13 VITALS — BP 124/82 | HR 65 | Temp 97.6°F | Ht 69.0 in | Wt 259.0 lb

## 2019-12-13 DIAGNOSIS — Z131 Encounter for screening for diabetes mellitus: Secondary | ICD-10-CM

## 2019-12-13 DIAGNOSIS — Z6838 Body mass index (BMI) 38.0-38.9, adult: Secondary | ICD-10-CM

## 2019-12-13 DIAGNOSIS — I10 Essential (primary) hypertension: Secondary | ICD-10-CM

## 2019-12-13 DIAGNOSIS — Z0000 Encounter for general adult medical examination without abnormal findings: Secondary | ICD-10-CM

## 2019-12-13 DIAGNOSIS — R748 Abnormal levels of other serum enzymes: Secondary | ICD-10-CM | POA: Insufficient documentation

## 2019-12-13 DIAGNOSIS — Z13220 Encounter for screening for lipoid disorders: Secondary | ICD-10-CM

## 2019-12-13 DIAGNOSIS — I119 Hypertensive heart disease without heart failure: Secondary | ICD-10-CM

## 2019-12-13 NOTE — Progress Notes (Signed)
Subjective:   HPI  Adam Burns is a 29 y.o. male who presents for Chief Complaint  Patient presents with  . Annual Exam    Patient Care Team: Adam Burns, Adam Burns as PCP - General (Family Medicine) Sees dentist Sees eye doctor Cardiology, Adam Burns Urology  Concerns: Doing fine.  wants to lose 40lb.  Exercising  Asthma - no recent issues  He notes he has been seeing urology and recent labs few months ago showed elevated liver tests.   They initially thought it was gall bladder but they decided not to operate.  Denies alcohol use or tylenol use.   No concern for hepatitis infection.   Reviewed their medical, surgical, family, social, medication, and allergy history and updated chart as appropriate.  Past Medical History:  Diagnosis Date  . Asthma   . Cardiomegaly   . Erectile dysfunction    urology consult 2018  . Hypertension 09/2018  . Obesity, unspecified     Past Surgical History:  Procedure Laterality Date  . PENILE DEBRIDEMENT     ED issue    Social History   Socioeconomic History  . Marital status: Single    Spouse name: Not on file  . Number of children: 0  . Years of education: Not on file  . Highest education level: Not on file  Occupational History  . Not on file  Tobacco Use  . Smoking status: Never Smoker  . Smokeless tobacco: Never Used  Substance and Sexual Activity  . Alcohol use: No  . Drug use: No  . Sexual activity: Not on file  Other Topics Concern  . Not on file  Social History Narrative   Lives alone, no children, working [employer].  Exercise - 5 times per week.  No significant other. 11/2019.     Social Determinants of Health   Financial Resource Strain:   . Difficulty of Paying Living Expenses: Not on file  Food Insecurity:   . Worried About Programme researcher, broadcasting/film/video in the Last Year: Not on file  . Ran Out of Food in the Last Year: Not on file  Transportation Needs:   . Lack of  Transportation (Medical): Not on file  . Lack of Transportation (Non-Medical): Not on file  Physical Activity:   . Days of Exercise per Week: Not on file  . Minutes of Exercise per Session: Not on file  Stress:   . Feeling of Stress : Not on file  Social Connections:   . Frequency of Communication with Friends and Family: Not on file  . Frequency of Social Gatherings with Friends and Family: Not on file  . Attends Religious Services: Not on file  . Active Member of Clubs or Organizations: Not on file  . Attends Banker Meetings: Not on file  . Marital Status: Not on file  Intimate Partner Violence:   . Fear of Current or Ex-Partner: Not on file  . Emotionally Abused: Not on file  . Physically Abused: Not on file  . Sexually Abused: Not on file    Family History  Problem Relation Age of Onset  . Hypertension Mother   . Diabetes Father   . Other Father        died of brain infection  . Heart disease Neg Hx   . Stroke Neg Hx      Current Outpatient Medications:  .  lisinopril-hydrochlorothiazide (ZESTORETIC) 20-12.5 MG tablet, TAKE 2 TABLETS BY MOUTH DAILY, Disp: 60  tablet, Rfl: 0 .  amLODipine (NORVASC) 5 MG tablet, Take 1 tablet (5 mg total) by mouth daily. (Patient not taking: Reported on 01/09/2019), Disp: 30 tablet, Rfl: 2 .  omeprazole (PRILOSEC) 40 MG capsule, TAKE 1 CAPSULE(40 MG) BY MOUTH DAILY (Patient not taking: Reported on 12/13/2019), Disp: 30 capsule, Rfl: 2  No Known Allergies     Review of Systems Constitutional: -fever, -chills, -sweats, -unexpected weight change, -decreased appetite, -fatigue Allergy: -sneezing, -itching, -congestion Dermatology: -changing moles, --rash, -lumps ENT: -runny nose, -ear pain, -sore throat, -hoarseness, -sinus pain, -teeth pain, - ringing in ears, -hearing loss, -nosebleeds Cardiology: -chest pain, -palpitations, -swelling, -difficulty breathing when lying flat, -waking up short of breath Respiratory: -cough,  -shortness of breath, -difficulty breathing with exercise or exertion, -wheezing, -coughing up blood Gastroenterology: -abdominal pain, -nausea, -vomiting, -diarrhea, -constipation, -blood in stool, -changes in bowel movement, -difficulty swallowing or eating Hematology: -bleeding, -bruising  Musculoskeletal: -joint aches, -muscle aches, -joint swelling, -back pain, -neck pain, -cramping, -changes in gait Ophthalmology: denies vision changes, eye redness, itching, discharge Urology: -burning with urination, -difficulty urinating, -blood in urine, -urinary frequency, -urgency, -incontinence Neurology: -headache, -weakness, -tingling, -numbness, -memory loss, -falls, -dizziness Psychology: -depressed mood, -agitation, -sleep problems Male GU: no testicular mass, pain, no lymph nodes swollen, no swelling, no rash.     Objective:  BP 124/82   Pulse 65   Temp 97.6 F (36.4 C)   Ht 5\' 9"  (1.753 m)   Wt 259 lb (117.5 kg)   SpO2 99%   BMI 38.25 kg/m   General appearance: alert, no distress, WD/WN, African American male Skin: unremarkable HEENT: normocephalic, conjunctiva/corneas normal, sclerae anicteric, PERRLA, EOMi, nares patent, no discharge or erythema, pharynx normal Oral cavity: MMM, tongue normal, teeth normal Neck: supple, no lymphadenopathy, no thyromegaly, no masses, normal ROM, no bruits Chest: non tender, normal shape and expansion Heart: RRR, normal S1, S2, no murmurs Lungs: CTA bilaterally, no wheezes, rhonchi, or rales Abdomen: +bs, soft, non tender, non distended, no masses, no hepatomegaly, no splenomegaly, no bruits Back: non tender, normal ROM, no scoliosis Musculoskeletal: upper extremities non tender, no obvious deformity, normal ROM throughout, lower extremities non tender, no obvious deformity, normal ROM throughout Extremities: no edema, no cyanosis, no clubbing Pulses: 2+ symmetric, upper and lower extremities, normal cap refill Neurological: alert, oriented x 3,  CN2-12 intact, strength normal upper extremities and lower extremities, sensation normal throughout, DTRs 2+ throughout, no cerebellar signs, gait normal Psychiatric: normal affect, behavior normal, pleasant  GU: normal male external genitalia,circumcised, nontender, no masses, no hernia, no lymphadenopathy Rectal: deferred   Assessment and Plan :   Encounter Diagnoses  Name Primary?  . Encounter for health maintenance examination in adult Yes  . Essential hypertension, benign   . BMI 38.0-38.9,adult   . Screening for diabetes mellitus   . Screening for lipid disorders   . Elevated liver enzymes     Physical exam - discussed and counseled on healthy lifestyle, diet, exercise, preventative care, vaccinations, sick and well care, proper use of emergency dept and after hours care, and addressed their concerns.    Health screening: See your eye doctor yearly for routine vision care. See your dentist yearly for routine dental care including hygiene visits twice yearly.   Cancer screening Advised monthly self testicular exam    Vaccinations: Advised yearly influenza vaccine  Up to date on Tetanus vaccine    Separate significant chronic issues discussed: Elevated LFTs - 10/25/2019 [place] of abdomen, +fatty liver disease and gall  stones.  Labs today for recheck  HTN - c/t same medication  Obesity - counseled on diet, exercise.    Barclay was seen today for annual exam.  Diagnoses and all orders for this visit:  Encounter for health maintenance examination in adult -     Comprehensive metabolic panel -     CBC with Differential/Platelet -     Lipid panel -     Hemoglobin A1c  Essential hypertension, benign  BMI 38.0-38.9,adult  Screening for diabetes mellitus -     Hemoglobin A1c  Screening for lipid disorders -     Lipid panel  Elevated liver enzymes -     Comprehensive metabolic panel    Follow-up pending labs, yearly for physical

## 2019-12-14 LAB — CBC WITH DIFFERENTIAL/PLATELET
Basophils Absolute: 0 10*3/uL (ref 0.0–0.2)
Basos: 1 %
EOS (ABSOLUTE): 0.1 10*3/uL (ref 0.0–0.4)
Eos: 3 %
Hematocrit: 41.3 % (ref 37.5–51.0)
Hemoglobin: 14.8 g/dL (ref 13.0–17.7)
Immature Grans (Abs): 0 10*3/uL (ref 0.0–0.1)
Immature Granulocytes: 0 %
Lymphocytes Absolute: 2.7 10*3/uL (ref 0.7–3.1)
Lymphs: 51 %
MCH: 32 pg (ref 26.6–33.0)
MCHC: 35.8 g/dL — ABNORMAL HIGH (ref 31.5–35.7)
MCV: 89 fL (ref 79–97)
Monocytes Absolute: 0.5 10*3/uL (ref 0.1–0.9)
Monocytes: 9 %
Neutrophils Absolute: 1.8 10*3/uL (ref 1.4–7.0)
Neutrophils: 36 %
Platelets: 289 10*3/uL (ref 150–450)
RBC: 4.62 x10E6/uL (ref 4.14–5.80)
RDW: 12.2 % (ref 11.6–15.4)
WBC: 5.1 10*3/uL (ref 3.4–10.8)

## 2019-12-14 LAB — LIPID PANEL
Chol/HDL Ratio: 2.8 ratio (ref 0.0–5.0)
Cholesterol, Total: 144 mg/dL (ref 100–199)
HDL: 51 mg/dL (ref >39)
LDL Chol Calc (NIH): 79 mg/dL (ref 0–99)
Triglycerides: 69 mg/dL (ref 0–149)
VLDL Cholesterol Cal: 14 mg/dL (ref 5–40)

## 2019-12-14 LAB — HEMOGLOBIN A1C
Est. average glucose Bld gHb Est-mCnc: 103 mg/dL
Hgb A1c MFr Bld: 5.2 % (ref 4.8–5.6)

## 2019-12-14 LAB — COMPREHENSIVE METABOLIC PANEL
ALT: 27 IU/L (ref 0–44)
AST: 20 IU/L (ref 0–40)
Albumin/Globulin Ratio: 1.9 (ref 1.2–2.2)
Albumin: 4.9 g/dL (ref 4.1–5.2)
Alkaline Phosphatase: 83 IU/L (ref 39–117)
BUN/Creatinine Ratio: 12 (ref 9–20)
BUN: 12 mg/dL (ref 6–20)
Bilirubin Total: 0.5 mg/dL (ref 0.0–1.2)
CO2: 26 mmol/L (ref 20–29)
Calcium: 9.9 mg/dL (ref 8.7–10.2)
Chloride: 101 mmol/L (ref 96–106)
Creatinine, Ser: 1.01 mg/dL (ref 0.76–1.27)
GFR calc Af Amer: 116 mL/min/{1.73_m2} (ref >59)
GFR calc non Af Amer: 100 mL/min/{1.73_m2} (ref >59)
Globulin, Total: 2.6 g/dL (ref 1.5–4.5)
Glucose: 85 mg/dL (ref 65–99)
Potassium: 4.3 mmol/L (ref 3.5–5.2)
Sodium: 140 mmol/L (ref 134–144)
Total Protein: 7.5 g/dL (ref 6.0–8.5)

## 2019-12-16 ENCOUNTER — Other Ambulatory Visit: Payer: Self-pay | Admitting: Medical

## 2019-12-16 DIAGNOSIS — I119 Hypertensive heart disease without heart failure: Secondary | ICD-10-CM

## 2020-01-13 ENCOUNTER — Other Ambulatory Visit: Payer: Self-pay | Admitting: Cardiology

## 2020-01-13 DIAGNOSIS — I119 Hypertensive heart disease without heart failure: Secondary | ICD-10-CM

## 2020-06-08 ENCOUNTER — Ambulatory Visit: Payer: 59 | Admitting: Medical

## 2020-06-24 ENCOUNTER — Ambulatory Visit: Payer: Managed Care, Other (non HMO) | Admitting: Family Medicine

## 2020-08-09 ENCOUNTER — Other Ambulatory Visit: Payer: Self-pay | Admitting: Cardiology

## 2020-08-09 DIAGNOSIS — I119 Hypertensive heart disease without heart failure: Secondary | ICD-10-CM

## 2020-08-14 ENCOUNTER — Telehealth: Payer: Self-pay

## 2020-08-14 NOTE — Telephone Encounter (Signed)
No refills w/o appt. Las seen Adam Burns 02/2019 was supposed to f/u in 65mo//ah

## 2020-08-14 NOTE — Telephone Encounter (Signed)
Patient called stating he needs a refill on Lisinopril HCTZ. Patient got last refill from Dr. Jacinto Halim. Dr. Jacinto Halim office has refused refill due to patient needing an appointment. Patient called our office and stated Dr. Jacinto Halim is no longer at his office. I called over to the office and Dr. Jacinto Halim is still there. Patient has made an appointment with you but has not scheduled with Dr. Jacinto Halim office. Please advise.

## 2020-08-17 ENCOUNTER — Other Ambulatory Visit: Payer: Self-pay | Admitting: Medical

## 2020-08-17 DIAGNOSIS — I119 Hypertensive heart disease without heart failure: Secondary | ICD-10-CM

## 2020-08-17 MED ORDER — LISINOPRIL-HYDROCHLOROTHIAZIDE 20-12.5 MG PO TABS: 2.0000 | ORAL_TABLET | Freq: Every day | ORAL | 0 refills | Status: DC

## 2020-08-17 NOTE — Telephone Encounter (Signed)
I sent 30-day supply.  Make sure he comes in for appointment

## 2020-08-28 ENCOUNTER — Encounter: Payer: Managed Care, Other (non HMO) | Admitting: Medical

## 2020-09-09 ENCOUNTER — Encounter: Payer: Self-pay | Admitting: Medical

## 2020-09-09 ENCOUNTER — Other Ambulatory Visit: Payer: Self-pay

## 2020-09-09 ENCOUNTER — Other Ambulatory Visit: Payer: Self-pay | Admitting: Medical

## 2020-09-09 ENCOUNTER — Ambulatory Visit (INDEPENDENT_AMBULATORY_CARE_PROVIDER_SITE_OTHER): Payer: Managed Care, Other (non HMO) | Admitting: Medical

## 2020-09-09 VITALS — BP 136/114 | HR 83 | Ht 69.0 in | Wt 276.4 lb

## 2020-09-09 DIAGNOSIS — Z0100 Encounter for examination of eyes and vision without abnormal findings: Secondary | ICD-10-CM | POA: Insufficient documentation

## 2020-09-09 DIAGNOSIS — Z23 Encounter for immunization: Secondary | ICD-10-CM | POA: Diagnosis not present

## 2020-09-09 DIAGNOSIS — I10 Essential (primary) hypertension: Secondary | ICD-10-CM | POA: Diagnosis not present

## 2020-09-09 DIAGNOSIS — I517 Cardiomegaly: Secondary | ICD-10-CM | POA: Diagnosis not present

## 2020-09-09 DIAGNOSIS — Z111 Encounter for screening for respiratory tuberculosis: Secondary | ICD-10-CM | POA: Insufficient documentation

## 2020-09-09 DIAGNOSIS — Z139 Encounter for screening, unspecified: Secondary | ICD-10-CM | POA: Insufficient documentation

## 2020-09-09 MED ORDER — AMLODIPINE BESYLATE 5 MG PO TABS: 5.0000 mg | ORAL_TABLET | Freq: Every day | ORAL | 1 refills | Status: DC

## 2020-09-09 MED ORDER — VALSARTAN-HYDROCHLOROTHIAZIDE 320-25 MG PO TABS: 1.0000 | ORAL_TABLET | Freq: Every day | ORAL | 1 refills | Status: DC

## 2020-09-09 NOTE — Progress Notes (Signed)
Subjective:  Adam Burns is a 29 y.o. male who presents for Chief Complaint  Patient presents with  . Medication Management     Here for medication follow-up in form for work.  He is working currently in [employer] school system.  He needs a physical form completed for work including tuberculosis screen and vaccine documentation.  He has no particular complaints  He is compliant with one of his blood pressure medications but is a little confused about which medication he should be taking  He had called in here in cardiology office a few months ago and has gotten 2 different reports about which medicine he should be taking so he is not quite sure.  He is not currently taking amlodipine.  At 1 point he was on valsartan HCT and back in January he was on lisinopril HCT  He has no chest pain, palpitations, shortness of breath, edema  No other aggravating or relieving factors.    No other c/o.  The following portions of the patient's history were reviewed and updated as appropriate: allergies, current medications, past family history, past medical history, past social history, past surgical history and problem list.  ROS Otherwise as in subjective above  Objective: BP (!) 136/114   Pulse 83   Ht 5\' 9"  (1.753 m)   Wt 276 lb 6.4 oz (125.4 kg)   SpO2 98%   BMI 40.82 kg/m   General appearance: alert, no distress, well developed, well nourished Neck: supple, no lymphadenopathy, no thyromegaly, no masses Heart: RRR, normal S1, S2, no murmurs Lungs: CTA bilaterally, no wheezes, rhonchi, or rales Pulses: 2+ radial pulses, 2+ pedal pulses, normal cap refill Ext: no edema Neuro: CN II through XII intact, nonfocal exam   Assessment: Encounter Diagnoses  Name Primary?  . Essential hypertension, benign Yes  . LVH (left ventricular hypertrophy)   . Encounter for vision screening   . Need for influenza vaccination   . Screening for condition   . Screening for tuberculosis       Plan: Unfortunately his blood pressures quite high today and out of control.  Part of this is due to misinformation.  Looking back through chart records, pharmacy records, telephone records, and communication between our office and him and cardiology and him, it seems quite easy that he could have been confused about his blood pressure medications.  At 1 point valsartan HCT must have been on backorder.  This would be the preferred medication over lisinopril HCT.  We are going to try to get him back on this medicine if it is not on back order.  If it is on back order we will stick with lisinopril but he was on 2 tablets daily at 1 point.  He should be back on amlodipine as well.  We discussed the importance of compliance with regimen and goal blood pressure.  We discussed complications of uncontrolled high blood pressure.  I advised he plan to follow-up with cardiology in about a month  Labs screening today along with vision and hearing screen today given his work form  We will call with results to complete his form  Seanmichael was seen today for medication management.  Diagnoses and all orders for this visit:  Essential hypertension, benign  LVH (left ventricular hypertrophy)  Encounter for vision screening  Need for influenza vaccination  Screening for condition -     Hepatitis B surface antibody,quantitative -     Measles/Mumps/Rubella Immunity -     QuantiFERON-TB Gold  Plus  Screening for tuberculosis -     QuantiFERON-TB Gold Plus  Other orders -     Discontinue: amLODipine (NORVASC) 5 MG tablet; Take 1 tablet (5 mg total) by mouth daily. -     Discontinue: valsartan-hydrochlorothiazide (DIOVAN-HCT) 320-25 MG tablet; Take 1 tablet by mouth daily.    Follow up: 52mo

## 2020-09-11 LAB — QUANTIFERON-TB GOLD PLUS
QuantiFERON Mitogen Value: >10 IU/mL
QuantiFERON Nil Value: 0.03 IU/mL
QuantiFERON TB1 Ag Value: 0.08 IU/mL
QuantiFERON TB2 Ag Value: 0.08 IU/mL
QuantiFERON-TB Gold Plus: NEGATIVE

## 2020-09-11 LAB — HEPATITIS B SURFACE ANTIBODY, QUANTITATIVE: Hepatitis B Surf Ab Quant: 59.8 m[IU]/mL (ref >9.9)

## 2020-09-11 LAB — MEASLES/MUMPS/RUBELLA IMMUNITY
MUMPS ABS, IGG: 169 AU/mL (ref >10.9)
RUBEOLA AB, IGG: >300 AU/mL (ref >16.4)
Rubella Antibodies, IGG: 2.82 index (ref >0.99)

## 2020-09-13 ENCOUNTER — Other Ambulatory Visit: Payer: Self-pay | Admitting: Medical

## 2020-09-13 DIAGNOSIS — I119 Hypertensive heart disease without heart failure: Secondary | ICD-10-CM

## 2020-10-06 ENCOUNTER — Other Ambulatory Visit: Payer: Self-pay | Admitting: Medical

## 2020-10-17 ENCOUNTER — Other Ambulatory Visit: Payer: Self-pay | Admitting: Medical

## 2020-11-14 ENCOUNTER — Other Ambulatory Visit: Payer: Self-pay | Admitting: Medical

## 2020-11-17 ENCOUNTER — Other Ambulatory Visit: Payer: Self-pay | Admitting: Medical

## 2020-12-14 ENCOUNTER — Ambulatory Visit (INDEPENDENT_AMBULATORY_CARE_PROVIDER_SITE_OTHER): Payer: BC Managed Care – PPO | Admitting: Medical

## 2020-12-14 ENCOUNTER — Other Ambulatory Visit: Payer: Self-pay

## 2020-12-14 ENCOUNTER — Encounter: Payer: Self-pay | Admitting: Medical

## 2020-12-14 VITALS — BP 110/84 | HR 96 | Ht 69.0 in | Wt 281.8 lb

## 2020-12-14 DIAGNOSIS — Z0000 Encounter for general adult medical examination without abnormal findings: Secondary | ICD-10-CM

## 2020-12-14 DIAGNOSIS — Z23 Encounter for immunization: Secondary | ICD-10-CM

## 2020-12-14 DIAGNOSIS — Z131 Encounter for screening for diabetes mellitus: Secondary | ICD-10-CM

## 2020-12-14 DIAGNOSIS — Z6841 Body Mass Index (BMI) 40.0 and over, adult: Secondary | ICD-10-CM

## 2020-12-14 NOTE — Progress Notes (Unsigned)
Subjective:   HPI  Adam Burns is a 30 y.o. male who presents for Chief Complaint  Patient presents with  . Annual Exam    Physical. Not fasting     Patient Care Team: Tysinger, Kermit Balo, PA-C as PCP - General (Family Medicine) Sees dentist Sees eye doctor Cardiology, Dr. Florian Buff, prior [facility] Cardiovascular Urology  Concerns: HTN - compliant with medicaiton  Asthma - no recent issues  Reviewed their medical, surgical, family, social, medication, and allergy history and updated chart as appropriate.  Past Medical History:  Diagnosis Date  . Asthma   . Cardiomegaly   . Erectile dysfunction    urology consult 2018  . Hypertension 09/2018  . Obesity, unspecified     Past Surgical History:  Procedure Laterality Date  . PENILE DEBRIDEMENT     ED issue    Social History   Socioeconomic History  . Marital status: Single    Spouse name: Not on file  . Number of children: 0  . Years of education: Not on file  . Highest education level: Not on file  Occupational History  . Not on file  Tobacco Use  . Smoking status: Never Smoker  . Smokeless tobacco: Never Used  Vaping Use  . Vaping Use: Never used  Substance and Sexual Activity  . Alcohol use: No  . Drug use: No  . Sexual activity: Not on file  Other Topics Concern  . Not on file  Social History Narrative   Lives alone, no children, working as Veterinary surgeon at [employer].  Exercise - 5 times per week.  No significant other.    Social Determinants of Health   Financial Resource Strain: Not on file  Food Insecurity: Not on file  Transportation Needs: Not on file  Physical Activity: Not on file  Stress: Not on file  Social Connections: Not on file  Intimate Partner Violence: Not on file    Family History  Problem Relation Age of Onset  . Hypertension Mother   . Diabetes Father   . Other Father        died of brain infection  . Heart disease Neg Hx   . Stroke Neg Hx      Current  Outpatient Medications:  .  amLODipine (NORVASC) 5 MG tablet, TAKE 1 TABLET(5 MG) BY MOUTH DAILY, Disp: 30 tablet, Rfl: 1 .  valsartan-hydrochlorothiazide (DIOVAN-HCT) 320-25 MG tablet, TAKE 1 TABLET BY MOUTH DAILY, Disp: 90 tablet, Rfl: 0 .  omeprazole (PRILOSEC) 40 MG capsule, TAKE 1 CAPSULE(40 MG) BY MOUTH DAILY (Patient not taking: No sig reported), Disp: 30 capsule, Rfl: 2  No Known Allergies     Review of Systems Constitutional: -fever, -chills, -sweats, -unexpected weight change, -decreased appetite, -fatigue Allergy: -sneezing, -itching, -congestion Dermatology: -changing moles, --rash, -lumps ENT: -runny nose, -ear pain, -sore throat, -hoarseness, -sinus pain, -teeth pain, - ringing in ears, -hearing loss, -nosebleeds Cardiology: -chest pain, -palpitations, -swelling, -difficulty breathing when lying flat, -waking up short of breath Respiratory: -cough, -shortness of breath, -difficulty breathing with exercise or exertion, -wheezing, -coughing up blood Gastroenterology: -abdominal pain, -nausea, -vomiting, -diarrhea, -constipation, -blood in stool, -changes in bowel movement, -difficulty swallowing or eating Hematology: -bleeding, -bruising  Musculoskeletal: -joint aches, -muscle aches, -joint swelling, -back pain, -neck pain, -cramping, -changes in gait Ophthalmology: denies vision changes, eye redness, itching, discharge Urology: -burning with urination, -difficulty urinating, -blood in urine, -urinary frequency, -urgency, -incontinence Neurology: -headache, -weakness, -tingling, -numbness, -memory loss, -falls, -dizziness Psychology: -depressed mood, -agitation, -  sleep problems Male GU: no testicular mass, pain, no lymph nodes swollen, no swelling, no rash.     Objective:  BP 110/84   Pulse 96   Ht 5\' 9"  (1.753 m)   Wt 281 lb 12.8 oz (127.8 kg)   SpO2 97%   BMI 41.61 kg/m     General appearence: alert, no distress, WD/WN, African American male HEENT: normocephalic,  sclerae anicteric, PERRLA, EOMi, nares patent, no discharge or erythema, pharynx normal Oral cavity: MMM, no lesions Neck: supple, no lymphadenopathy, no thyromegaly, no masses ,no bruits Heart: RRR, normal S1, S2, no murmurs Lungs: CTA bilaterally, no wheezes, rhonchi, or rales Abdomen: +bs, soft, non tender, non distended, no masses, no hepatomegaly, no splenomegaly Back: non tender Musculoskeletal: nontender, no swelling, no obvious deformity Extremities: no edema, no cyanosis, no clubbing Pulses: 2+ symmetric, upper and lower extremities, normal cap refill Neurological: alert, oriented x 3, CN2-12 intact, strength normal upper extremities and lower extremities, sensation normal throughout, DTRs 2+ throughout, no cerebellar signs, gait normal Psychiatric: normal affect, behavior normal, pleasant  male, circumcised, no hernia, no mass Rectal - deferred    Assessment and Plan :   Encounter Diagnoses  Name Primary?  . Encounter for health maintenance examination in adult Yes  . Need for influenza vaccination   . Screening for diabetes mellitus   . BMI 40.0-44.9, adult [facility])     Physical exam - discussed and counseled on healthy lifestyle, diet, exercise, preventative care, vaccinations, sick and well care, proper use of emergency dept and after hours care, and addressed their concerns.    Today you had a preventative care visit or wellness visit.    Topics today may have included healthy lifestyle, diet, exercise, preventative care, vaccinations, sick and well care, proper use of emergency dept and after hours care, as well as other concerns.     Recommendations: Continue to return yearly for your annual wellness and preventative care visits.  This gives [facility] a chance to discuss healthy lifestyle, exercise, vaccinations, review your chart record, and perform screenings where appropriate.  I recommend you see your eye doctor yearly for routine vision care.  I recommend you  see your dentist yearly for routine dental care including hygiene visits twice yearly.   Vaccination recommendations were reviewed  Counseled on the influenza virus vaccine.  Vaccine information sheet given.  Influenza vaccine given after consent obtained.  He will come in soon on a Friday for covid booster at his request.  He has had 1st set of covid vaccines.    Up to date on tetanus booster.   Screening for cancer: Colon cancer screening:  Age 30yo  Cancer screening You should do a monthly self testicular exam   We discussed PSA, prostate exam, and prostate cancer screening risks/benefits.     Skin cancer screening: Check your skin regularly for new changes, growing lesions, or other lesions of concern Come in for evaluation if you have skin lesions of concern.  Lung cancer screening: If you have a greater than 30 pack year history of tobacco use, then you qualify for lung cancer screening with a chest CT scan  We currently don't have screenings for other cancers besides breast, cervical, colon, and lung cancers.  If you have a strong family history of cancer or have other cancer screening concerns, please let me know.    Bone health: Get at least 150 minutes of aerobic exercise weekly Get weight bearing exercise at least once weekly  Heart health: Get at least 150 minutes of aerobic exercise weekly Limit alcohol It is important to maintain a healthy blood pressure and healthy cholesterol numbers   Separate significant issues discussed: Cardiomegaly, LVH - f/u yearly with cardiology , will request recent echocardiogram from cardiologist in [facility], [place]  HTN - c/t current medication  Obesity - counseled on need for weight loss through healthy diet and  Exercise   Ayush was seen today for annual exam.  Diagnoses and all orders for this visit:  Encounter for health maintenance examination in adult -     Comprehensive metabolic panel -     CBC -     LDL  Cholesterol, Direct -     Hemoglobin A1c  Need for influenza vaccination  Screening for diabetes mellitus -     Hemoglobin A1c  BMI 40.0-44.9, adult (HCC) -     Hemoglobin A1c  Other orders -     Flu Vaccine QUAD 6+ mos PF IM (Fluarix Quad PF)   Follow-up pending labs, yearly for physical

## 2020-12-14 NOTE — Progress Notes (Signed)
I see no notes for me to do anything  I did schedule pt for cpe next year and request records for pt

## 2020-12-15 LAB — COMPREHENSIVE METABOLIC PANEL
ALT: 32 IU/L (ref 0–44)
AST: 24 IU/L (ref 0–40)
Albumin/Globulin Ratio: 1.9 (ref 1.2–2.2)
Albumin: 4.9 g/dL (ref 4.0–5.0)
Alkaline Phosphatase: 97 IU/L (ref 44–121)
BUN/Creatinine Ratio: 12 (ref 9–20)
BUN: 13 mg/dL (ref 6–20)
Bilirubin Total: 0.4 mg/dL (ref 0.0–1.2)
CO2: 24 mmol/L (ref 20–29)
Calcium: 10.1 mg/dL (ref 8.7–10.2)
Chloride: 103 mmol/L (ref 96–106)
Creatinine, Ser: 1.05 mg/dL (ref 0.76–1.27)
GFR calc Af Amer: 109 mL/min/{1.73_m2} (ref >59)
GFR calc non Af Amer: 94 mL/min/{1.73_m2} (ref >59)
Globulin, Total: 2.6 g/dL (ref 1.5–4.5)
Glucose: 87 mg/dL (ref 65–99)
Potassium: 4.1 mmol/L (ref 3.5–5.2)
Sodium: 142 mmol/L (ref 134–144)
Total Protein: 7.5 g/dL (ref 6.0–8.5)

## 2020-12-15 LAB — HEMOGLOBIN A1C
Est. average glucose Bld gHb Est-mCnc: 103 mg/dL
Hgb A1c MFr Bld: 5.2 % (ref 4.8–5.6)

## 2020-12-15 LAB — CBC
Hematocrit: 42.5 % (ref 37.5–51.0)
Hemoglobin: 14.5 g/dL (ref 13.0–17.7)
MCH: 30.9 pg (ref 26.6–33.0)
MCHC: 34.1 g/dL (ref 31.5–35.7)
MCV: 90 fL (ref 79–97)
Platelets: 326 10*3/uL (ref 150–450)
RBC: 4.7 x10E6/uL (ref 4.14–5.80)
RDW: 12.7 % (ref 11.6–15.4)
WBC: 5.3 10*3/uL (ref 3.4–10.8)

## 2020-12-15 LAB — LDL CHOLESTEROL, DIRECT: LDL Direct: 77 mg/dL (ref 0–99)

## 2021-01-19 ENCOUNTER — Telehealth: Payer: Self-pay | Admitting: Medical

## 2021-01-19 NOTE — Telephone Encounter (Signed)
Received response in reference to a medical records request sent to [facility]. Per fax not a pt there.

## 2021-01-29 ENCOUNTER — Other Ambulatory Visit: Payer: Self-pay | Admitting: Medical

## 2021-02-17 ENCOUNTER — Other Ambulatory Visit: Payer: Self-pay | Admitting: Medical

## 2021-04-16 ENCOUNTER — Ambulatory Visit: Payer: BC Managed Care – PPO | Admitting: Medical

## 2021-04-29 ENCOUNTER — Other Ambulatory Visit: Payer: Self-pay | Admitting: Medical

## 2021-05-04 ENCOUNTER — Ambulatory Visit: Payer: BC Managed Care – PPO | Admitting: Medical

## 2021-05-20 ENCOUNTER — Other Ambulatory Visit: Payer: Self-pay | Admitting: Medical

## 2021-06-18 ENCOUNTER — Ambulatory Visit (INDEPENDENT_AMBULATORY_CARE_PROVIDER_SITE_OTHER): Payer: BC Managed Care – PPO | Admitting: Medical

## 2021-06-18 ENCOUNTER — Other Ambulatory Visit: Payer: Self-pay

## 2021-06-18 VITALS — BP 130/82 | HR 81 | Ht 69.0 in | Wt 279.3 lb

## 2021-06-18 DIAGNOSIS — I119 Hypertensive heart disease without heart failure: Secondary | ICD-10-CM | POA: Diagnosis not present

## 2021-06-18 DIAGNOSIS — Z6841 Body Mass Index (BMI) 40.0 and over, adult: Secondary | ICD-10-CM | POA: Diagnosis not present

## 2021-06-18 DIAGNOSIS — I517 Cardiomegaly: Secondary | ICD-10-CM | POA: Diagnosis not present

## 2021-06-18 MED ORDER — SEMAGLUTIDE (1 MG/DOSE) 4 MG/3ML ~~LOC~~ SOPN: 1.0000 mg | PEN_INJECTOR | SUBCUTANEOUS | 1 refills | Status: DC

## 2021-06-18 NOTE — Patient Instructions (Signed)
   Breakfast You may eat 1 of the following Smoothie with Almond milk, handful of kale or spinach, and 1-2 fruit servings of your choice such as berries or 1/2 banana Whole grain slice of toast and thin layer of low sugar jam or small amount of honey Whole grain slice of toast and avocado spread 1/2 cup of steel cut oats (oatmeal)   Mid-morning snack 1 fruit serving such as one of the following: medium-sized apple medium-sized orange, Tangerine 1/2 banana  3/4 cup of fresh berries or frozen berries  A protein source such as one of the following: 8 almonds  small handful of walnuts or other nuts Hummus and vegetable such as carrots   Lunch A protein source such as 1 of the following: 1 serving of beans such as black beans, pinto beans, green beans, or edamame (soy beans) Veggie burger  Non breaded fish such as salmon or tuna, either baked, grilled, or broiled Vegetable - Half of your plate should be a non-starchy vegetables!  So avoid white potatoes and corn.  Otherwise, eat a large portion of vegetables. Avocado, cucumber, tomato, carrots, greens, lettuce, squash, okra, etc.  Vegetables can include salad with olive oil/vinaigrette dressing Grains such as 1/2 cup of brown rice, quinoa, barley or other whole grain or 1 or 2 slices of whole grain bread    Dinner A protein source such as 1 of the following: 1 serving of beans such as black beans, pinto beans, green beans, or edamame (soy beans) Veggie burger  Non breaded fish such as salmon or tuna, either baked, grilled, or broiled Vegetable - Half of your plate should be a non-starchy vegetables!  So avoid white potatoes and corn.  Otherwise, eat a large portion of vegetables. Avocado, cucumber, tomato, carrots, greens, lettuce, squash, okra, etc.  Vegetables can include salad with olive oil/vinaigrette dressing Grains such as 1/2 cup of brown rice, quinoa, barley or other whole grain or 1 or 2 slices of whole grain bread    Beverages: Water Unsweet tea Home made juice with a juicer without sugar added other than small bit of honey or agave nectar Water with sugar free flavor such as Mio   AVOID.... For the time being I want you to cut out the following items completely: Soda, sweet tea, juice, beer or wine or alcohol Sweets such as cake, candy, pies, chips, cookies, chocolate

## 2021-06-18 NOTE — Progress Notes (Signed)
Subjective:  Adam Burns is a 30 y.o. male who presents for Chief Complaint  Patient presents with   follow-up on weight    Follow-up on weight     Here for follow-up.  I saw him in January for physical.  At that time I recommended he work on lifestyle changes to lose weight.  Unfortunately he is not losing weight despite regular exercise.  He notes his diet needs to be worked on that he has trouble controlling this.  He is exercising most days per week with both aerobic and some weightbearing exercise  He would like to consider other options for helping with his weight  Last cardiology visit may have been over a year ago  He has no current symptoms of concern.  He has never been on weight loss medication.  No other aggravating or relieving factors.    No other c/o.  Past Medical History:  Diagnosis Date   Asthma    Cardiomegaly    Erectile dysfunction    urology consult 2018   Hypertension 09/2018   Obesity, unspecified    Current Outpatient Medications on File Prior to Visit  Medication Sig Dispense Refill   amLODipine (NORVASC) 5 MG tablet TAKE 1 TABLET(5 MG) BY MOUTH DAILY 90 tablet 0   omeprazole (PRILOSEC) 40 MG capsule TAKE 1 CAPSULE(40 MG) BY MOUTH DAILY 30 capsule 2   valsartan-hydrochlorothiazide (DIOVAN-HCT) 320-25 MG tablet TAKE 1 TABLET BY MOUTH DAILY 90 tablet 0   No current facility-administered medications on file prior to visit.     The following portions of the patient's history were reviewed and updated as appropriate: allergies, current medications, past family history, past medical history, past social history, past surgical history and problem list.  ROS Otherwise as in subjective above  Objective: BP 130/82   Pulse 81   Ht 5\' 9"  (1.753 m)   Wt 279 lb 4.8 oz (126.7 kg)   SpO2 99%   BMI 41.25 kg/m   General appearance: alert, no distress, well developed, well nourished Psych: Pleasant, answers questions  appropriately    Assessment: Encounter Diagnoses  Name Primary?   Hypertension with heart disease Yes   LVH (left ventricular hypertrophy)    Cardiomegaly    BMI 40.0-44.9, adult (HCC)      Plan: Hypertension, LVH, cardiomegaly-continue current blood pressure medicine but referral back to cardiology for recheck.  He seems to be past due for cardiology follow-up, and last echocardiogram in 2019 showed significant LVH.  We are referring to bariatric clinic so he will likely need cardiac clearance anyhow  BMI -referral to bariatric surgery after discussing options.  He is also agreeable to trial of medication.  We spent a considerable amount of time talking about diet and cutting out soda and sweets.  He is exercising regularly.  Advised if the medicine too expensive or not covered by insurance let me know right away  [name] was seen today for follow-up on weight.  Diagnoses and all orders for this visit:  Hypertension with heart disease -     Amb Referral to Bariatric Surgery -     Ambulatory referral to Cardiology  LVH (left ventricular hypertrophy) -     Amb Referral to Bariatric Surgery -     Ambulatory referral to Cardiology  Cardiomegaly -     Amb Referral to Bariatric Surgery -     Ambulatory referral to Cardiology  BMI 40.0-44.9, adult (HCC)  Other orders -     Semaglutide, 1  MG/DOSE, 4 MG/3ML SOPN; Inject 1 mg as directed once a week.   Follow up: pending referral, 61mo on medication

## 2021-06-28 NOTE — Progress Notes (Signed)
Primary Physician/Referring:  Jac Canavan, PA-C  Patient ID: Adam Burns, male    DOB: Oct 13, 1991, 31 y.o.   MRN: [id number]  Chief Complaint  Patient presents with   Follow-up   Hypertension   Heart Problem   CARDIOMEGALY    HPI:    Adam Burns  is a 30 y.o. male with obesity, hypertension, and anxiety.  Adam Burns was last seen in our office by Dr. Sherril Croon 11/02/2018.  Adam Burns is now referred back for cardiology care from PCP for evaluation and management of hypertensive heart disease, LVH, cardiomegaly.  In 2019 patient was originally referred to our office for evaluation of chest pain and hypertension, at that time echocardiogram revealed severe LVH likely secondary to hypertension and obesity.  Patient's antihypertensive medications were uptitrated and Adam Burns was advised to follow-up in 4 months, however patient was lost to follow-up until now.  Patient's PCP is also referred him to bariatric surgery as Adam Burns has unfortunately continued to gain weight since 2019.  Patient is presently relatively asymptomatic.  Denies palpitations, syncope, near syncope, dizziness, dyspnea.  Adam Burns does report a single episode yesterday of chest pain at rest lasting 15 minutes.  Over the last 2 months patient states Adam Burns has been focusing on improving his diet and exercising regularly.  Presently Adam Burns exercises 30 to 45 minutes 6 days/week.   Notably patient does have a history of snoring, has not had sleep study in the past.  Past Medical History:  Diagnosis Date   Asthma    Cardiomegaly    Erectile dysfunction    urology consult 2018   Hypertension 09/2018   Obesity, unspecified    Past Surgical History:  Procedure Laterality Date   PENILE DEBRIDEMENT     ED issue   Family History  Problem Relation Age of Onset   Hypertension Mother    Diabetes Father    Other Father        died of brain infection   Heart disease Neg Hx    Stroke Neg Hx     Social History   Tobacco Use   Smoking status:  Never   Smokeless tobacco: Never  Substance Use Topics   Alcohol use: No   Marital Status: Single   ROS  Review of Systems  Constitutional: Negative for malaise/fatigue and weight gain.  Cardiovascular:  Positive for chest pain (single episode). Negative for claudication, leg swelling, near-syncope, orthopnea, palpitations, paroxysmal nocturnal dyspnea and syncope.  Respiratory:  Negative for shortness of breath.   Neurological:  Negative for dizziness.   Objective  Blood pressure 128/86, pulse 66, temperature 98.3 F (36.8 C), temperature source Temporal, resp. rate 17, height 5\' 9"  (1.753 m), weight 281 lb (127.5 kg), SpO2 100 %.  Vitals with BMI 06/29/2021 06/29/2021 06/18/2021  Height - 5\' 9"  5\' 9"   Weight - 281 lbs 279 lbs 5 oz  BMI - 41.48 41.23  Systolic 128 147 08/18/2021  Diastolic 86 117 82  Pulse 66 68 81      Physical Exam Vitals reviewed.  Constitutional:      Appearance: Adam Burns is obese.  Cardiovascular:     Rate and Rhythm: Normal rate and regular rhythm.     Pulses: Intact distal pulses.          Carotid pulses are 2+ on the right side and 2+ on the left side.      Radial pulses are 2+ on the right side and 2+ on the left side.  Popliteal pulses are 2+ on the right side and 2+ on the left side.       Dorsalis pedis pulses are 2+ on the right side and 2+ on the left side.       Posterior tibial pulses are 2+ on the right side and 2+ on the left side.     Heart sounds: S1 normal and S2 normal. No murmur heard.   No gallop.  Pulmonary:     Effort: Pulmonary effort is normal. No respiratory distress.     Breath sounds: No wheezing, rhonchi or rales.  Musculoskeletal:     Right lower leg: No edema.     Left lower leg: No edema.  Neurological:     Mental Status: Adam Burns is alert.    Laboratory examination:   Recent Labs    12/14/20 1434  NA 142  K 4.1  CL 103  CO2 24  GLUCOSE 87  BUN 13  CREATININE 1.05  CALCIUM 10.1  GFRNONAA 94  GFRAA 109   CrCl cannot  be calculated (Patient's most recent lab result is older than the maximum 21 days allowed.).  CMP Latest Ref Rng & Units 12/14/2020 12/13/2019  Glucose 65 - 99 mg/dL 87 85  BUN 6 - 20 mg/dL 13 12  Creatinine 3.81 - 1.27 mg/dL 0.17 5.10  Sodium 258 - 144 mmol/L 142 140  Potassium 3.5 - 5.2 mmol/L 4.1 4.3  Chloride 96 - 106 mmol/L 103 101  CO2 20 - 29 mmol/L 24 26  Calcium 8.7 - 10.2 mg/dL 52.7 9.9  Total Protein 6.0 - 8.5 g/dL 7.5 7.5  Total Bilirubin 0.0 - 1.2 mg/dL 0.4 0.5  Alkaline Phos 44 - 121 IU/L 97 83  AST 0 - 40 IU/L 24 20  ALT 0 - 44 IU/L 32 27   CBC Latest Ref Rng & Units 12/14/2020 12/13/2019  WBC 3.4 - 10.8 x10E3/uL 5.3 5.1  Hemoglobin 13.0 - 17.7 g/dL 78.2 42.3  Hematocrit 53.6 - 51.0 % 42.5 41.3  Platelets 150 - 450 x10E3/uL 326 289   Lipid Panel Recent Labs    12/14/20 1434  LDLDIRECT 77   HEMOGLOBIN A1C Lab Results  Component Value Date   HGBA1C 5.2 12/14/2020   TSH No results for input(s): TSH in the last 8760 hours.  External labs:   None   Allergies  No Known Allergies   Medications Prior to Visit:   Outpatient Medications Prior to Visit  Medication Sig Dispense Refill   amLODipine (NORVASC) 5 MG tablet TAKE 1 TABLET(5 MG) BY MOUTH DAILY 90 tablet 0   valsartan-hydrochlorothiazide (DIOVAN-HCT) 320-25 MG tablet TAKE 1 TABLET BY MOUTH DAILY 90 tablet 0   Semaglutide, 1 MG/DOSE, 4 MG/3ML SOPN Inject 1 mg as directed once a week. (Patient not taking: Reported on 06/29/2021) 3 mL 1   omeprazole (PRILOSEC) 40 MG capsule TAKE 1 CAPSULE(40 MG) BY MOUTH DAILY 30 capsule 2   No facility-administered medications prior to visit.   Final Medications at End of Visit    Current Meds  Medication Sig   amLODipine (NORVASC) 5 MG tablet TAKE 1 TABLET(5 MG) BY MOUTH DAILY   valsartan-hydrochlorothiazide (DIOVAN-HCT) 320-25 MG tablet TAKE 1 TABLET BY MOUTH DAILY   Radiology:   No results found.  Cardiac Studies:   Echocardiogram 10/25/2018: Left ventricle  cavity normal in size.  Severe concentric hypertrophy of the LV.  Normal global wall motion.  Normal diastolic filling pattern.  Calculated LVEF 56% Left atrial cavity moderately dilated Mild (  grade 1) mitral regurgitation Mild tricuspid regurgitation.  Estimated PASP 23 mmHg  EKG:   06/29/2021: Sinus rhythm rate of 60 bpm.  Left axis.  Incomplete right bundle branch block.  LVH.   Assessment     ICD-10-CM   1. Hypertensive heart disease without heart failure  I11.9 EKG 12-Lead    PCV ECHOCARDIOGRAM COMPLETE    2. LVH (left ventricular hypertrophy)  I51.7 PCV ECHOCARDIOGRAM COMPLETE       Medications Discontinued During This Encounter  Medication Reason   omeprazole (PRILOSEC) 40 MG capsule Error    No orders of the defined types were placed in this encounter.   Recommendations:   Adam Burns is a 30 y.o. AA male with history of obesity, hypertension, and hypertensive heart disease.  Adam Burns was last evaluated in our office in 2019 and now referred back by his PCP for continued cardiac vascular care.  Patient is relatively asymptomatic, although Adam Burns did have a single episode of noncardiac chest pain yesterday.  Recommended watchful waiting.  Echocardiogram in 2019 did reveal severe concentric hypertrophy of the LV with mild MR and TR.  We will therefore obtain repeat echocardiogram.  I personally reviewed external labs, lipids are well controlled.Blood pressure is well controlled, will continue amlodipine and valsartan/hydrochlorothiazide.  Discussed at length with patient regarding the importance of diet and lifestyle modifications, particularly weight loss.  Patient appears motivated and plans to follow-up with referral to bariatric surgery evaluation.  Patient is otherwise stable from a cardiovascular standpoint.  As long as echocardiogram is without significant abnormalities we will plan to follow-up in 6 months, sooner if needed, for hypertensive heart disease.   Adam Halsted, PA-C 06/29/2021, 1:56 PM Office: [phone]

## 2021-06-29 ENCOUNTER — Other Ambulatory Visit: Payer: Self-pay

## 2021-06-29 ENCOUNTER — Encounter: Payer: Self-pay | Admitting: Student

## 2021-06-29 ENCOUNTER — Ambulatory Visit: Payer: BC Managed Care – PPO | Admitting: Student

## 2021-06-29 VITALS — BP 128/86 | HR 66 | Temp 98.3°F | Resp 17 | Ht 69.0 in | Wt 281.0 lb

## 2021-06-29 DIAGNOSIS — I119 Hypertensive heart disease without heart failure: Secondary | ICD-10-CM

## 2021-06-29 DIAGNOSIS — I517 Cardiomegaly: Secondary | ICD-10-CM

## 2021-07-02 ENCOUNTER — Other Ambulatory Visit: Payer: Self-pay

## 2021-07-02 ENCOUNTER — Ambulatory Visit: Payer: BC Managed Care – PPO

## 2021-07-02 DIAGNOSIS — I119 Hypertensive heart disease without heart failure: Secondary | ICD-10-CM

## 2021-07-02 DIAGNOSIS — I517 Cardiomegaly: Secondary | ICD-10-CM

## 2021-07-04 NOTE — Progress Notes (Signed)
Echo unchanged compared to 2019. Moderate hypertrophy of heart muscle and mild valve disease.

## 2021-07-05 NOTE — Progress Notes (Signed)
Called patient, NA, LMAM

## 2021-07-05 NOTE — Progress Notes (Signed)
Patient called back, I have discussed lab with him.

## 2021-07-25 ENCOUNTER — Other Ambulatory Visit: Payer: Self-pay | Admitting: Medical

## 2021-08-23 ENCOUNTER — Ambulatory Visit (INDEPENDENT_AMBULATORY_CARE_PROVIDER_SITE_OTHER): Payer: BC Managed Care – PPO

## 2021-08-23 ENCOUNTER — Ambulatory Visit
Admission: EM | Admit: 2021-08-23 | Discharge: 2021-08-23 | Disposition: A | Discharge status: Home / Self Care | Payer: BC Managed Care – PPO | Attending: Urgent Care | Admitting: Urgent Care

## 2021-08-23 ENCOUNTER — Other Ambulatory Visit: Payer: Self-pay | Admitting: Medical

## 2021-08-23 ENCOUNTER — Other Ambulatory Visit: Payer: Self-pay

## 2021-08-23 DIAGNOSIS — M79671 Pain in right foot: Secondary | ICD-10-CM

## 2021-08-23 MED ORDER — NAPROXEN 375 MG PO TABS: 375.0000 mg | ORAL_TABLET | Freq: Two times a day (BID) | ORAL | 0 refills | Status: DC

## 2021-08-23 NOTE — ED Provider Notes (Signed)
[facility]   MRN: [id number] DOB: June 10, 1991  Subjective:   Adam Burns is a 30 y.o. male presenting for 2-day history of acute onset right foot pain over the dorsum of his foot medially close to the first great toe.  Patient awoke with symptoms.  No trauma, falls, history of gout.  No musculoskeletal disorders.  Patient is able to take NSAIDs.  He is an avid runner, uses a treadmill and does the elliptical as well.  He has been resting over the past 5 days and has noticed that he has had improvement but wanted to get evaluated.  No current facility-administered medications for this encounter.  Current Outpatient Medications:    amLODipine (NORVASC) 5 MG tablet, TAKE 1 TABLET(5 MG) BY MOUTH DAILY, Disp: 90 tablet, Rfl: 0   Semaglutide, 1 MG/DOSE, 4 MG/3ML SOPN, Inject 1 mg as directed once a week., Disp: 3 mL, Rfl: 1   valsartan-hydrochlorothiazide (DIOVAN-HCT) 320-25 MG tablet, TAKE 1 TABLET BY MOUTH DAILY, Disp: 90 tablet, Rfl: 0   No Known Allergies  Past Medical History:  Diagnosis Date   Asthma    Cardiomegaly    Erectile dysfunction    urology consult 2018   Hypertension 09/2018   Obesity, unspecified      Past Surgical History:  Procedure Laterality Date   PENILE DEBRIDEMENT     ED issue    Family History  Problem Relation Age of Onset   Hypertension Mother    Diabetes Father    Other Father        died of brain infection   Heart disease Neg Hx    Stroke Neg Hx     Social History   Tobacco Use   Smoking status: Never   Smokeless tobacco: Never  Vaping Use   Vaping Use: Never used  Substance Use Topics   Alcohol use: No   Drug use: No    ROS   Objective:   Vitals: BP (!) 145/89 (BP Location: Right Arm)   Pulse 74   Temp (!) 97.2 F (36.2 C) (Oral)   Resp 18   SpO2 98%   Physical Exam Constitutional:      General: He is not in acute distress.    Appearance: Normal appearance. He is well-developed and normal weight.  He is not ill-appearing, toxic-appearing or diaphoretic.  HENT:     Head: Normocephalic and atraumatic.     Right Ear: External ear normal.     Left Ear: External ear normal.     Nose: Nose normal.     Mouth/Throat:     Pharynx: Oropharynx is clear.  Eyes:     General: No scleral icterus.       Right eye: No discharge.        Left eye: No discharge.     Extraocular Movements: Extraocular movements intact.     Pupils: Pupils are equal, round, and reactive to light.  Cardiovascular:     Rate and Rhythm: Normal rate.  Pulmonary:     Effort: Pulmonary effort is normal.  Musculoskeletal:     Cervical back: Normal range of motion.       Feet:     Comments: Tenderness over area outlined without any erythema, ecchymosis, bony deformity.  No warmth.  No wounds.  Neurological:     Mental Status: He is alert and oriented to person, place, and time.  Psychiatric:        Mood and Affect: Mood normal.  Behavior: Behavior normal.        Thought Content: Thought content normal.        Judgment: Judgment normal.    Assessment and Plan :   PDMP not reviewed this encounter.  1. Right foot pain     Suspect inflammatory pain related to his type of exercise including the use of a treadmill.  He has improved since he has been resting and therefore recommended staying away from the treadmill, using naproxen for pain and inflammation.  Offered patient an x-ray, will notify him of any abnormal results.  Otherwise if the x-ray is normal will not contact patient. Counseled patient on potential for adverse effects with medications prescribed/recommended today, ER and return-to-clinic precautions discussed, patient verbalized understanding.    Wallis Bamberg, PA-C 08/23/21 [id number]

## 2021-08-23 NOTE — ED Triage Notes (Signed)
Pt presents with distal R foot pain that was present upon awakening 2 days ago.  No known injury or stressor.  Increased pain and swelling with walking. Has improved some today but still bothersome. Some redness noted at base of toes.  Has not taken OTC meds.  Did ice and elevate with some relief.

## 2021-10-22 ENCOUNTER — Other Ambulatory Visit: Payer: Self-pay | Admitting: Medical

## 2021-11-18 ENCOUNTER — Other Ambulatory Visit: Payer: Self-pay | Admitting: Medical

## 2021-11-18 NOTE — Telephone Encounter (Signed)
Pt has an appt in February 

## 2021-12-15 ENCOUNTER — Encounter: Payer: BC Managed Care – PPO | Admitting: Medical

## 2021-12-30 ENCOUNTER — Ambulatory Visit: Payer: BC Managed Care – PPO | Admitting: Student

## 2021-12-30 ENCOUNTER — Other Ambulatory Visit: Payer: Self-pay

## 2021-12-30 ENCOUNTER — Encounter: Payer: Self-pay | Admitting: Student

## 2021-12-30 VITALS — BP 130/85 | HR 66 | Temp 98.2°F | Resp 17 | Ht 69.0 in | Wt 275.0 lb

## 2021-12-30 DIAGNOSIS — I517 Cardiomegaly: Secondary | ICD-10-CM

## 2021-12-30 DIAGNOSIS — I119 Hypertensive heart disease without heart failure: Secondary | ICD-10-CM

## 2021-12-30 NOTE — Progress Notes (Signed)
Primary Physician/Referring:  Carlena Hurl, PA-C  Patient ID: Adam Burns, male    DOB: 1990-12-17, 31 y.o.   MRN: [id number]  No chief complaint on file.   HPI:    JULIANN Burns  is a 31 y.o. male with obesity, hypertension, hypertensive heart disease, and anxiety.  Patient was previously seen in our office in 2019 and referred back in 06/2021 by PCP for evaluation and management of hypertensive heart disease, LVH, cardiomegaly.   Patient presents for 26-month follow-up.  At last office visit ordered repeat echocardiogram which revealed LVEF of 50-55% with moderate LVH and normal diastolic filling pattern.  Echo also showed mild MR and TR, no significant change compared to previous echo in 2019.  Last office visit patient's blood pressure was well controlled, therefore no changes were made.  However patient was advised to keep follow-up with bariatric surgery evaluation.  Patient unfortunately canceled his last appointment for bariatric surgical evaluation, however he does plan to follow-up "when things are less busy at work".  Patient has been feeling well overall without specific complaints today.  Denies orthopnea, PND, leg edema.  Denies chest pain, dyspnea.  He exercises 4 days/week doing at least 30 minutes on the treadmill without issue.  He does admit that he continues to struggle to make dietary changes.  Past Medical History:  Diagnosis Date   Asthma    Cardiomegaly    Erectile dysfunction    urology consult 2018   Hypertension 09/2018   Obesity, unspecified    Past Surgical History:  Procedure Laterality Date   PENILE DEBRIDEMENT     ED issue   Family History  Problem Relation Age of Onset   Hypertension Mother    Diabetes Father    Other Father        died of brain infection   Heart disease Neg Hx    Stroke Neg Hx     Social History   Tobacco Use   Smoking status: Never   Smokeless tobacco: Never  Substance Use Topics   Alcohol use: No    Marital Status: Single   ROS  Review of Systems  Constitutional: Positive for weight loss. Negative for weight gain.  Cardiovascular:  Negative for chest pain, claudication, leg swelling, near-syncope, orthopnea, palpitations, paroxysmal nocturnal dyspnea and syncope.  Respiratory:  Negative for shortness of breath.   Neurological:  Negative for dizziness.   Objective  Blood pressure 130/85, pulse 66, temperature 98.2 F (36.8 C), temperature source Temporal, resp. rate 17, height 5\' 9"  (1.753 m), weight 275 lb (124.7 kg), SpO2 99 %.  Vitals with BMI 12/30/2021 08/23/2021 06/29/2021  Height 5\' 9"  - -  Weight 275 lbs - -  BMI [id number] - -  Systolic [id number]  Diastolic 85 89 86  Pulse 66 74 66      Physical Exam Vitals reviewed.  Constitutional:      Appearance: He is obese.  Cardiovascular:     Rate and Rhythm: Normal rate and regular rhythm.     Pulses: Intact distal pulses.          Carotid pulses are 2+ on the right side and 2+ on the left side.      Radial pulses are 2+ on the right side and 2+ on the left side.       Popliteal pulses are 2+ on the right side and 2+ on the left side.       Dorsalis pedis pulses are 2+  on the right side and 2+ on the left side.       Posterior tibial pulses are 2+ on the right side and 2+ on the left side.     Heart sounds: S1 normal and S2 normal. No murmur heard.   No gallop.  Pulmonary:     Effort: Pulmonary effort is normal. No respiratory distress.     Breath sounds: No wheezing, rhonchi or rales.  Musculoskeletal:     Right lower leg: No edema.     Left lower leg: No edema.    Laboratory examination:   No results for input(s): NA, K, CL, CO2, GLUCOSE, BUN, CREATININE, CALCIUM, GFRNONAA, GFRAA in the last 8760 hours.  CrCl cannot be calculated (Patient's most recent lab result is older than the maximum 21 days allowed.).  CMP Latest Ref Rng & Units 12/14/2020 12/13/2019  Glucose 65 - 99 mg/dL 87 85  BUN 6 - 20 mg/dL 13 12   Creatinine 0.76 - 1.27 mg/dL 1.05 1.01  Sodium 134 - 144 mmol/L 142 140  Potassium 3.5 - 5.2 mmol/L 4.1 4.3  Chloride 96 - 106 mmol/L 103 101  CO2 20 - 29 mmol/L 24 26  Calcium 8.7 - 10.2 mg/dL 10.1 9.9  Total Protein 6.0 - 8.5 g/dL 7.5 7.5  Total Bilirubin 0.0 - 1.2 mg/dL 0.4 0.5  Alkaline Phos 44 - 121 IU/L 97 83  AST 0 - 40 IU/L 24 20  ALT 0 - 44 IU/L 32 27   CBC Latest Ref Rng & Units 12/14/2020 12/13/2019  WBC 3.4 - 10.8 x10E3/uL 5.3 5.1  Hemoglobin 13.0 - 17.7 g/dL 14.5 14.8  Hematocrit 37.5 - 51.0 % 42.5 41.3  Platelets 150 - 450 x10E3/uL 326 289   Lipid Panel No results for input(s): CHOL, TRIG, LDLCALC, VLDL, HDL, CHOLHDL, LDLDIRECT in the last 8760 hours.  HEMOGLOBIN A1C Lab Results  Component Value Date   HGBA1C 5.2 12/14/2020   TSH No results for input(s): TSH in the last 8760 hours.  External labs:   None   Allergies  No Known Allergies   Medications Prior to Visit:   Outpatient Medications Prior to Visit  Medication Sig Dispense Refill   amLODipine (NORVASC) 5 MG tablet TAKE 1 TABLET(5 MG) BY MOUTH DAILY 90 tablet 0   clonazePAM (KLONOPIN) 1 MG tablet Take 1 mg by mouth 2 (two) times daily.     valsartan-hydrochlorothiazide (DIOVAN-HCT) 320-25 MG tablet TAKE 1 TABLET BY MOUTH DAILY 90 tablet 0   naproxen (NAPROSYN) 375 MG tablet Take 1 tablet (375 mg total) by mouth 2 (two) times daily with a meal. 30 tablet 0   Semaglutide, 1 MG/DOSE, 4 MG/3ML SOPN Inject 1 mg as directed once a week. 3 mL 1   No facility-administered medications prior to visit.   Final Medications at End of Visit    Current Meds  Medication Sig   amLODipine (NORVASC) 5 MG tablet TAKE 1 TABLET(5 MG) BY MOUTH DAILY   clonazePAM (KLONOPIN) 1 MG tablet Take 1 mg by mouth 2 (two) times daily.   valsartan-hydrochlorothiazide (DIOVAN-HCT) 320-25 MG tablet TAKE 1 TABLET BY MOUTH DAILY   Radiology:   No results found.  Cardiac Studies:   PCV ECHOCARDIOGRAM COMPLETE  0000000 Normal LV systolic function with visual EF 50-55%. Left ventricle cavity is normal in size. Moderate left ventricular hypertrophy. Normal global wall motion. Normal diastolic filling pattern, normal LAP. Mild (Grade I) mitral regurgitation. Mild tricuspid regurgitation. No evidence of pulmonary hypertension. Compared to study  10/25/2018 no significant change.   EKG:  12/30/2021: likely sinus rhythm at a rate of 65 bpm.  Left axis.  Incomplete right bundle branch block.  LVH.    06/29/2021: Sinus rhythm rate of 60 bpm.  Left axis.  Incomplete right bundle branch block.  LVH.   Assessment     ICD-10-CM   1. Hypertensive heart disease without heart failure  I11.9 EKG 12-Lead    2. LVH (left ventricular hypertrophy)  I51.7        Medications Discontinued During This Encounter  Medication Reason   naproxen (NAPROSYN) 375 MG tablet    Semaglutide, 1 MG/DOSE, 4 MG/3ML SOPN     No orders of the defined types were placed in this encounter.    Recommendations:   ZAYEN MCWILLIAMS is a 31 y.o. AA male with history of obesity, hypertension, and hypertensive heart disease.  He was last evaluated in our office in 2019 and now referred back by his PCP for continued cardiac vascular care.  Patient presents for 90-month follow-up.  At last office visit ordered repeat echocardiogram which revealed LVEF of 50-55% with moderate LVH and normal diastolic filling pattern.  Echo also showed mild MR and TR, no significant change compared to previous echo in 2019.  Last office visit patient's blood pressure was well controlled, therefore no changes were made.  However patient was advised to keep follow-up with bariatric surgery evaluation.  Patient remains relatively asymptomatic and EKG and physical exam are unchanged compared to previous.  Patient is congratulated on 7 pound weight loss.  He admits to continued difficulty with dietary compliance, however I have encouraged him to really focus on  this as well as continue with regular exercise.  Also advised patient to follow-up with weight loss clinic as he was previously referred.  Reviewed and discussed results of echocardiogram, details above.  Patient's questions were addressed to his satisfaction.  Continue amlodipine and valsartan/hydrochlorothiazide.  There is no clinical evidence of heart failure.  Blood pressure is well controlled.  Will defer lipid management to patient's PCP.  Follow-up in 1 year, sooner if needed, for hypertension and hypertensive heart disease.   Alethia Berthold, PA-C 12/30/2021, 4:14 PM Office: [phone]

## 2022-01-22 ENCOUNTER — Other Ambulatory Visit: Payer: Self-pay | Admitting: Medical

## 2022-02-14 IMAGING — DX DG FOOT COMPLETE 3+V*R*
3 series · 3 of 3 positions shown · non-contrast
Comparison: None.

CLINICAL DATA: Right foot pain, no known injury

EXAM:
RIGHT FOOT COMPLETE - 3+ VIEW

[foot ap]
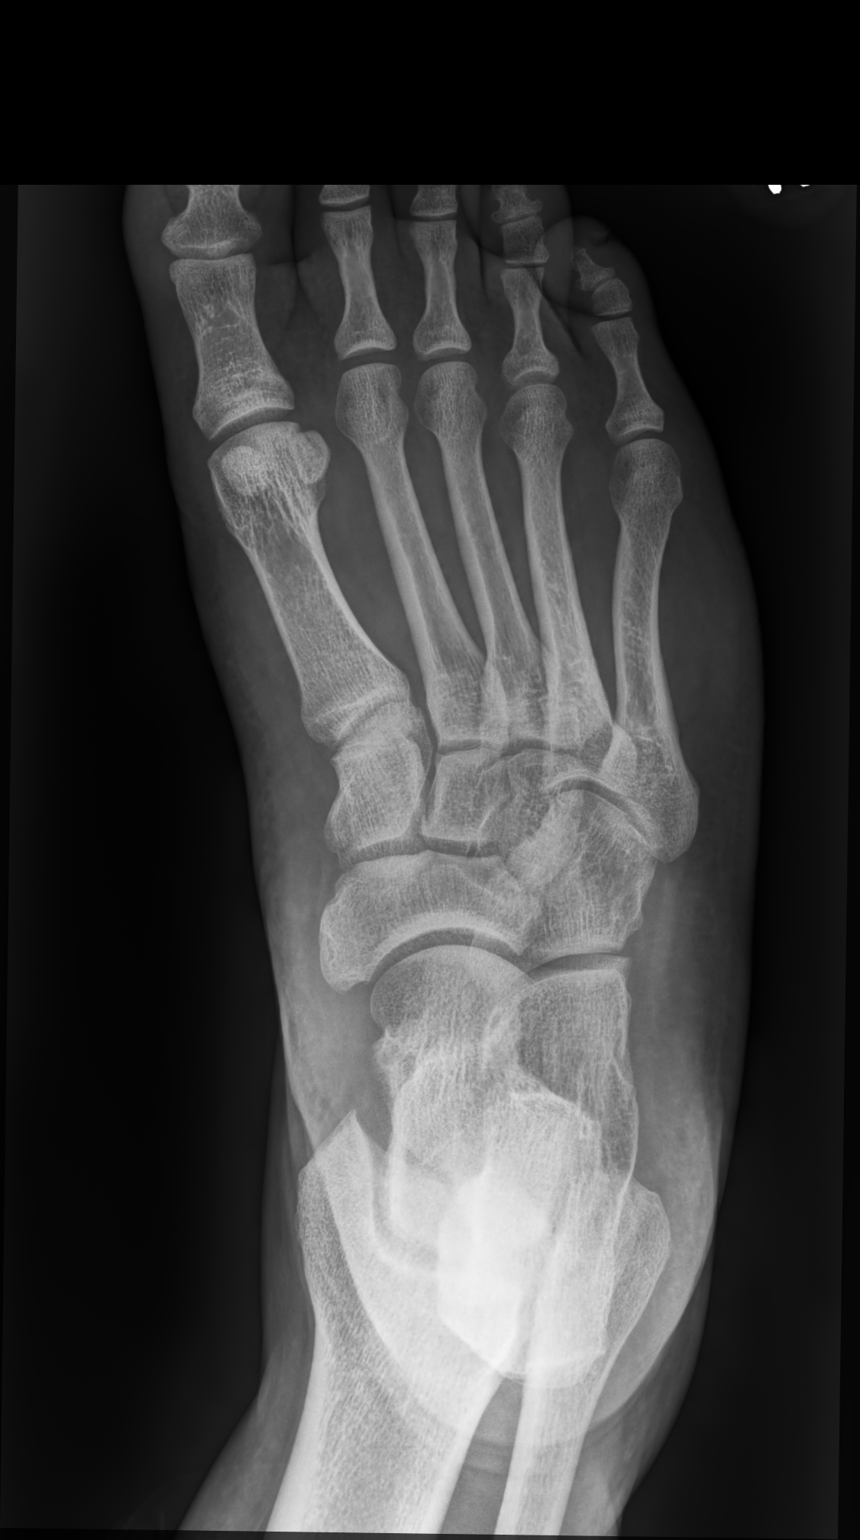

[foot mlo]
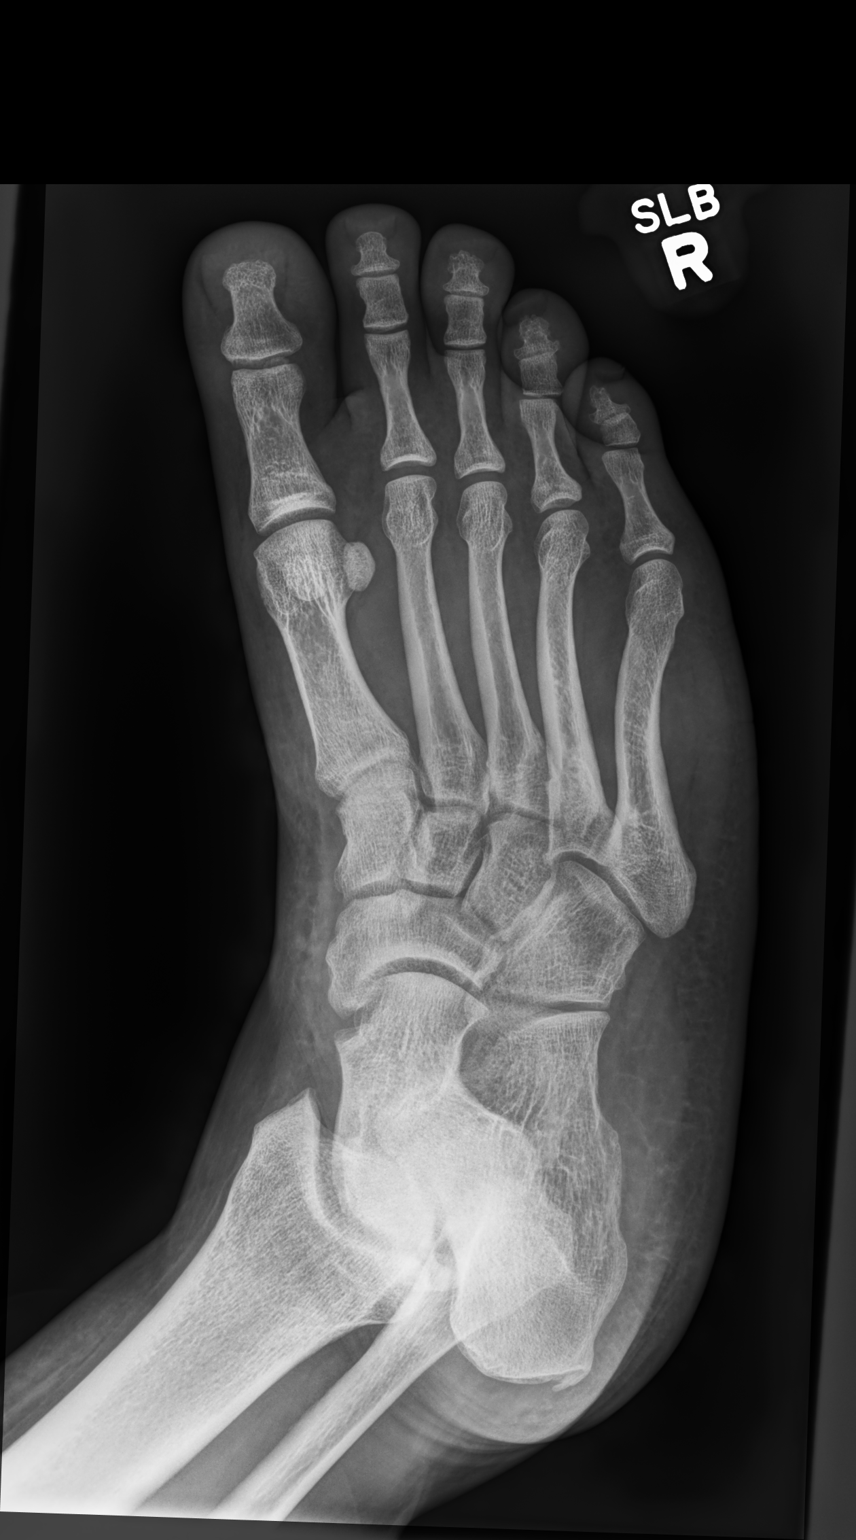

[foot lat]
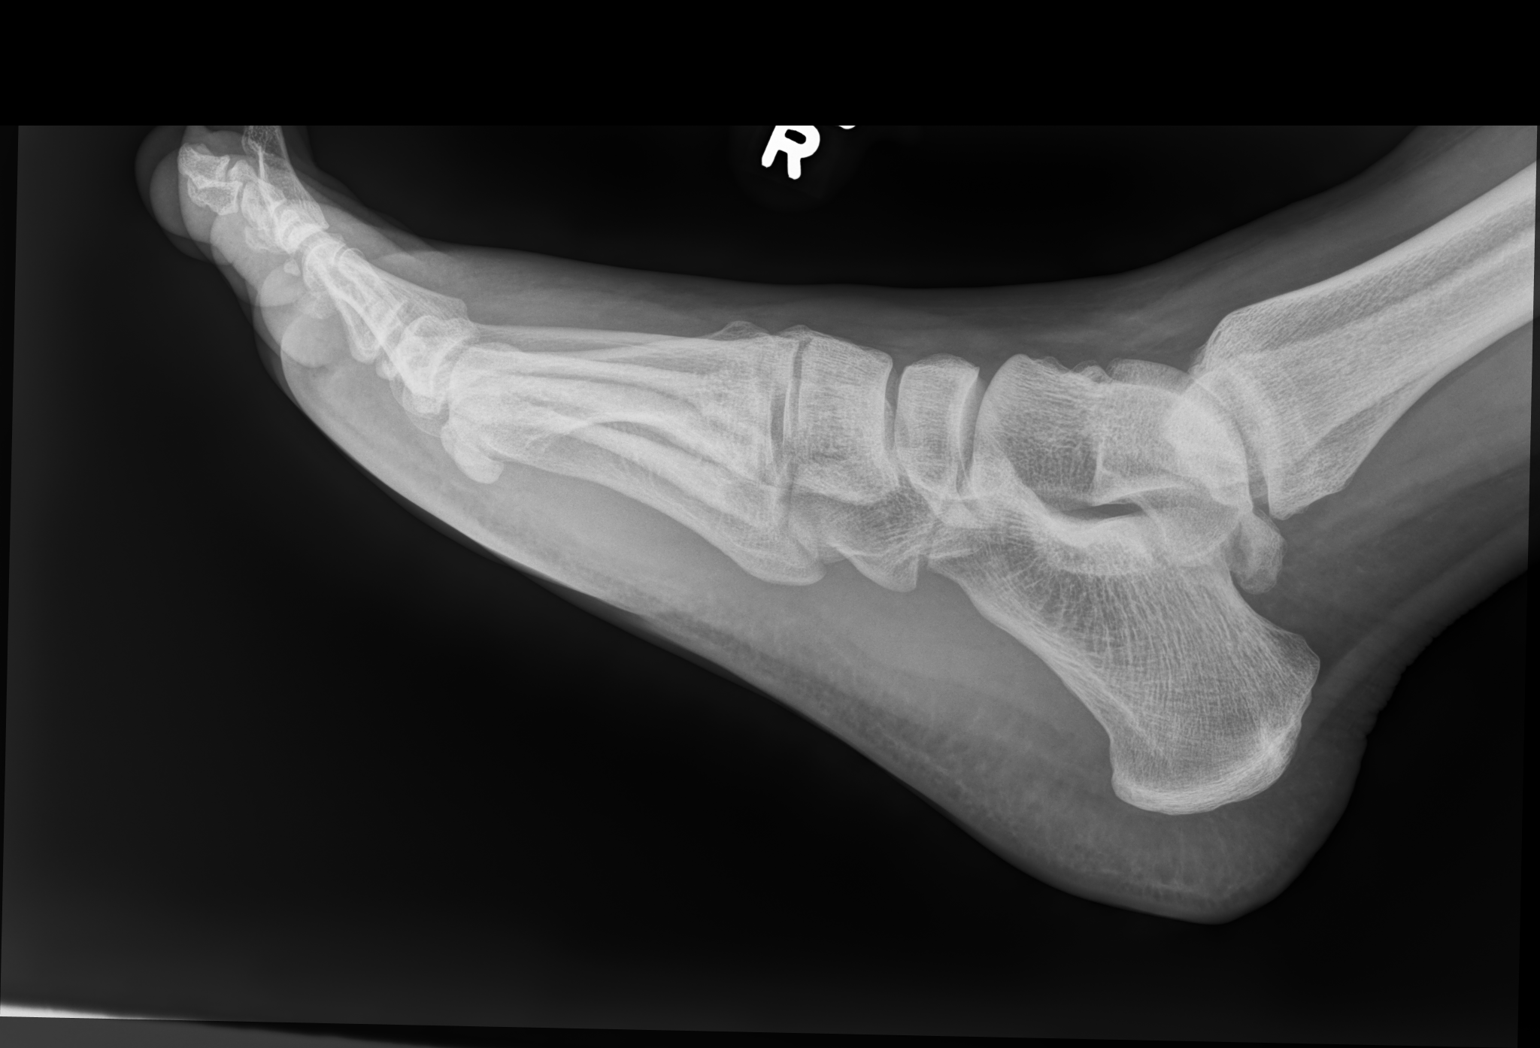

[3 of 3 positions shown; findings below may reference images not displayed]

FINDINGS: There is no evidence of fracture or dislocation. There is no
evidence of arthropathy or other focal bone abnormality. Soft
tissues are unremarkable.
IMPRESSION: No fracture or dislocation of the right foot. Joint spaces are
preserved.

## 2022-03-10 ENCOUNTER — Other Ambulatory Visit: Payer: Self-pay | Admitting: Medical

## 2022-04-05 ENCOUNTER — Other Ambulatory Visit: Payer: Self-pay | Admitting: Medical

## 2022-04-14 ENCOUNTER — Other Ambulatory Visit: Payer: Self-pay | Admitting: Medical

## 2022-06-27 ENCOUNTER — Other Ambulatory Visit: Payer: Self-pay | Admitting: Medical

## 2022-06-27 NOTE — Telephone Encounter (Signed)
Pt scheduled an appt for September 7th. I will refill for 30 days

## 2022-07-21 ENCOUNTER — Ambulatory Visit: Payer: BC Managed Care – PPO | Admitting: Medical

## 2022-07-21 VITALS — BP 120/70 | HR 78 | Wt 277.8 lb

## 2022-07-21 DIAGNOSIS — Z23 Encounter for immunization: Secondary | ICD-10-CM

## 2022-07-21 DIAGNOSIS — I10 Essential (primary) hypertension: Secondary | ICD-10-CM | POA: Diagnosis not present

## 2022-07-21 DIAGNOSIS — R351 Nocturia: Secondary | ICD-10-CM

## 2022-07-21 DIAGNOSIS — Z131 Encounter for screening for diabetes mellitus: Secondary | ICD-10-CM

## 2022-07-21 LAB — POCT URINALYSIS DIP (PROADVANTAGE DEVICE)
Bilirubin, UA: NEGATIVE
Blood, UA: NEGATIVE
Glucose, UA: NEGATIVE mg/dL
Ketones, POC UA: NEGATIVE mg/dL
Leukocytes, UA: NEGATIVE
Nitrite, UA: NEGATIVE
Protein Ur, POC: NEGATIVE mg/dL
Specific Gravity, Urine: 1.015
Urobilinogen, Ur: NEGATIVE
pH, UA: 6 (ref 5.0–8.0)

## 2022-07-21 NOTE — Progress Notes (Signed)
Subjective:  Adam Burns is a 31 y.o. male who presents for Chief Complaint  Patient presents with   med check    Med check, urination frequency more at night, flu shot     Here for yearly med check.  Wasn't scheduled as a physical  Hypertension - compliant with amlodipine 5mg  daily and Valsartan HCT 320/25mg  daily.   No chest pain, no leg swelling, no headache.  Exercising - 30 minutes cardio or more at least 5 days.   Does some weights  Eating habits - trying to be good ,but still drinks a fair amount of sodas.    Asthma - no recent problems.   Has some urinary frequency but mainly is getting at night at least once or twice to urinate.    No other aggravating or relieving factors.    No other c/o.  Past Medical History:  Diagnosis Date   Asthma    Cardiomegaly    Erectile dysfunction    urology consult 2018   Hypertension 09/2018   Obesity, unspecified    Current Outpatient Medications on File Prior to Visit  Medication Sig Dispense Refill   amLODipine (NORVASC) 5 MG tablet TAKE 1 TABLET(5 MG) BY MOUTH DAILY 30 tablet 0   clonazePAM (KLONOPIN) 1 MG tablet Take 1 mg by mouth 2 (two) times daily.     valsartan-hydrochlorothiazide (DIOVAN-HCT) 320-25 MG tablet TAKE 1 TABLET BY MOUTH DAILY 90 tablet 3   No current facility-administered medications on file prior to visit.     The following portions of the patient's history were reviewed and updated as appropriate: allergies, current medications, past family history, past medical history, past social history, past surgical history and problem list.  ROS Otherwise as in subjective above   Objective: BP 120/70   Pulse 78   Wt 277 lb 12.8 oz (126 kg)   BMI 41.02 kg/m   Wt Readings from Last 3 Encounters:  07/21/22 277 lb 12.8 oz (126 kg)  12/30/21 275 lb (124.7 kg)  06/29/21 281 lb (127.5 kg)   BP Readings from Last 3 Encounters:  07/21/22 120/70  12/30/21 130/85  08/23/21 (!) 145/89   General  appearance: alert, no distress, well developed, well nourished Neck: supple, no lymphadenopathy, no thyromegaly, no masses Heart: RRR, normal S1, S2, no murmurs Lungs: CTA bilaterally, no wheezes, rhonchi, or rales Abdomen: +bs, soft, non tender, non distended, no masses, no hepatomegaly, no splenomegaly Pulses: 2+ radial pulses, 2+ pedal pulses, normal cap refill Ext: no edema   Assessment: Encounter Diagnoses  Name Primary?   Essential hypertension, benign Yes   Needs flu shot    Screening for diabetes mellitus    Nocturia      Plan: HTN - at goal, c/t medicaiton but take both in the mornings.  He has been sometimes taking them later in the day when he forgets.  Consider lower HCT dose given nocturia/urine concerns  Nocturia - limit fluids late in the day, cut out soda, limit caffeine in evening /afternoon.  Labs today  Counseled on the influenza virus vaccine.  Vaccine information sheet given.  Influenza vaccine given after consent obtained.   [name] was seen today for med check.  Diagnoses and all orders for this visit:  Essential hypertension, benign -     Comprehensive metabolic panel  Needs flu shot -     Flu Vaccine QUAD 55mo+IM (Fluarix, Fluzone & Alfiuria Quad PF)  Screening for diabetes mellitus -     Hemoglobin A1c  Nocturia -     POCT Urinalysis DIP (Proadvantage Device)    Follow up: pending labs

## 2022-07-22 ENCOUNTER — Other Ambulatory Visit: Payer: Self-pay | Admitting: Medical

## 2022-07-22 LAB — COMPREHENSIVE METABOLIC PANEL
ALT: 39 IU/L (ref 0–44)
AST: 25 IU/L (ref 0–40)
Albumin/Globulin Ratio: 1.6 (ref 1.2–2.2)
Albumin: 4.9 g/dL (ref 4.1–5.1)
Alkaline Phosphatase: 96 IU/L (ref 44–121)
BUN/Creatinine Ratio: 12 (ref 9–20)
BUN: 13 mg/dL (ref 6–20)
Bilirubin Total: 0.4 mg/dL (ref 0.0–1.2)
CO2: 24 mmol/L (ref 20–29)
Calcium: 10.5 mg/dL — ABNORMAL HIGH (ref 8.7–10.2)
Chloride: 100 mmol/L (ref 96–106)
Creatinine, Ser: 1.09 mg/dL (ref 0.76–1.27)
Globulin, Total: 3 g/dL (ref 1.5–4.5)
Glucose: 91 mg/dL (ref 70–99)
Potassium: 3.8 mmol/L (ref 3.5–5.2)
Sodium: 141 mmol/L (ref 134–144)
Total Protein: 7.9 g/dL (ref 6.0–8.5)
eGFR: 92 mL/min/{1.73_m2} (ref >59)

## 2022-07-22 LAB — HEMOGLOBIN A1C
Est. average glucose Bld gHb Est-mCnc: 105 mg/dL
Hgb A1c MFr Bld: 5.3 % (ref 4.8–5.6)

## 2022-07-22 MED ORDER — VALSARTAN-HYDROCHLOROTHIAZIDE 320-12.5 MG PO TABS: 1.0000 | ORAL_TABLET | Freq: Every day | ORAL | 3 refills | Status: DC

## 2022-07-22 MED ORDER — AMLODIPINE BESYLATE 5 MG PO TABS: ORAL_TABLET | ORAL | 3 refills | Status: DC

## 2022-10-25 ENCOUNTER — Encounter: Payer: BC Managed Care – PPO | Admitting: Medical

## 2022-11-30 ENCOUNTER — Encounter: Payer: BC Managed Care – PPO | Admitting: Medical

## 2022-12-29 ENCOUNTER — Ambulatory Visit: Payer: BC Managed Care – PPO | Admitting: Student

## 2023-02-15 ENCOUNTER — Ambulatory Visit: Payer: BC Managed Care – PPO | Admitting: Medical

## 2023-02-15 VITALS — BP 120/82 | HR 68 | Temp 97.5°F | Wt 283.8 lb

## 2023-02-15 DIAGNOSIS — R109 Unspecified abdominal pain: Secondary | ICD-10-CM

## 2023-02-15 DIAGNOSIS — R002 Palpitations: Secondary | ICD-10-CM | POA: Diagnosis not present

## 2023-02-15 LAB — COMPREHENSIVE METABOLIC PANEL
ALT: 47 IU/L — ABNORMAL HIGH (ref 0–44)
AST: 29 IU/L (ref 0–40)
Albumin/Globulin Ratio: 1.6 (ref 1.2–2.2)
Albumin: 4.5 g/dL (ref 4.1–5.1)
Alkaline Phosphatase: 99 IU/L (ref 44–121)
BUN/Creatinine Ratio: 11 (ref 9–20)
BUN: 11 mg/dL (ref 6–20)
Bilirubin Total: 0.4 mg/dL (ref 0.0–1.2)
CO2: 21 mmol/L (ref 20–29)
Calcium: 9.9 mg/dL (ref 8.7–10.2)
Chloride: 104 mmol/L (ref 96–106)
Creatinine, Ser: 1.01 mg/dL (ref 0.76–1.27)
Globulin, Total: 2.8 g/dL (ref 1.5–4.5)
Glucose: 91 mg/dL (ref 70–99)
Potassium: 4 mmol/L (ref 3.5–5.2)
Sodium: 140 mmol/L (ref 134–144)
Total Protein: 7.3 g/dL (ref 6.0–8.5)
eGFR: 101 mL/min/{1.73_m2} (ref >59)

## 2023-02-15 LAB — CBC
Hematocrit: 43.6 % (ref 37.5–51.0)
Hemoglobin: 14.7 g/dL (ref 13.0–17.7)
MCH: 30.5 pg (ref 26.6–33.0)
MCHC: 33.7 g/dL (ref 31.5–35.7)
MCV: 91 fL (ref 79–97)
Platelets: 293 10*3/uL (ref 150–450)
RBC: 4.82 x10E6/uL (ref 4.14–5.80)
RDW: 12.7 % (ref 11.6–15.4)
WBC: 5.2 10*3/uL (ref 3.4–10.8)

## 2023-02-15 LAB — TSH: TSH: 1.57 u[IU]/mL (ref 0.450–4.500)

## 2023-02-15 NOTE — Progress Notes (Signed)
Subjective:  Adam Burns is a 32 y.o. male who presents for Chief Complaint  Patient presents with   multiple issues    Right side pain- going on a few months  Heart feels like he is skipping a beat sometimes. Not painful      Here for pain in right side.  This pain has been there intermittent a few months, better last few days, but worse in the past week.  He points to area of right flank, laterally and lower abdomen.  Pain can last a day or hours.   No associated nausea or vomiting.   No chills, no fever, no diarrhea.  Has daily BM.   No blood in urine, no pain with urination.  No bleeding in the abdomen.  No back pain.  No pain with activity or stretching.  He also notes skipped beat of heart at times.  No associated chest pain, shortness of breath, sweating, nausea or dizziness.  Lately is happening 1-2 times per day.  Does not interfere with activity.  Non-smoker.  No alcohol use.  He does drink some soda at least once per day.  No other aggravating or relieving factors.    No other c/o.  Past Medical History:  Diagnosis Date   Asthma    Cardiomegaly    Erectile dysfunction    urology consult 2018   Hypertension 09/2018   Obesity, unspecified    Current Outpatient Medications on File Prior to Visit  Medication Sig Dispense Refill   valsartan-hydrochlorothiazide (DIOVAN-HCT) 320-12.5 MG tablet Take 1 tablet by mouth daily. 90 tablet 3   amLODipine (NORVASC) 5 MG tablet TAKE 1 TABLET(5 MG) BY MOUTH DAILY (Patient not taking: Reported on 02/15/2023) 90 tablet 3   clonazePAM (KLONOPIN) 1 MG tablet Take 1 mg by mouth 2 (two) times daily. (Patient not taking: Reported on 02/15/2023)     No current facility-administered medications on file prior to visit.     The following portions of the patient's history were reviewed and updated as appropriate: allergies, current medications, past family history, past medical history, past social history, past surgical history and problem  list.  ROS Otherwise as in subjective above  Objective: BP 120/82   Pulse 68   Temp (!) 97.5 F (36.4 C)   Wt 283 lb 12.8 oz (128.7 kg)   BMI 41.91 kg/m   BP Readings from Last 3 Encounters:  02/15/23 120/82  07/21/22 120/70  12/30/21 130/85   Wt Readings from Last 3 Encounters:  02/15/23 283 lb 12.8 oz (128.7 kg)  07/21/22 277 lb 12.8 oz (126 kg)  12/30/21 275 lb (124.7 kg)    General appearance: alert, no distress, well developed, well nourished Neck: supple, no lymphadenopathy, no thyromegaly, no masses Heart: RRR, normal S1, S2, no murmurs Lungs: CTA bilaterally, no wheezes, rhonchi, or rales Abdomen: +bs, soft, mild deep tenderness right lateral abdomen but some tendnerss lateral RUQ as well.  Otherwise nontender, non distended, no masses, no hepatomegaly, no splenomegaly Pulses: 2+ radial pulses, 2+ pedal pulses, normal cap refill Ext: no edema   Assessment: Encounter Diagnoses  Name Primary?   Palpitation Yes   Right sided abdominal pain    Serum calcium elevated      Plan: Palpitations Possibly caffeine related Limit caffeine, cut back on sugary drinks or soda We will check some labs.  Labs normal, consider cardiology referral for event monitor testing to rule out arrhythmia  Right-sided abdominal pain Unclear etiology Possibly gallbladder or kidney related  given the location and characteristics of the pain Labs today.  Consider abdominal ultrasound or CT Avoid greasy or fatty foods and large portions  Elevated calcium on prior labs in September 2023-updated labs as below   Adam Burns was seen today for multiple issues.  Diagnoses and all orders for this visit:  Palpitation -     CBC -     TSH -     Comprehensive metabolic panel  Right sided abdominal pain -     CBC -     TSH -     Comprehensive metabolic panel  Serum calcium elevated   Follow up: pending labs

## 2023-02-16 ENCOUNTER — Other Ambulatory Visit: Payer: Self-pay | Admitting: Medical

## 2023-02-16 DIAGNOSIS — R109 Unspecified abdominal pain: Secondary | ICD-10-CM

## 2023-02-16 DIAGNOSIS — R002 Palpitations: Secondary | ICD-10-CM

## 2023-02-16 NOTE — Progress Notes (Signed)
Labs show normal blood counts, normal thyroid, normal electrolytes.  Liver test slightly elevated.  Recommendations: 1-I recommend referral to cardiology for further testing event monitor testing to see if there is any abnormal rhythm of the heart 2-if agreeable we can start with an ultrasound of the abdomen to further evaluate his side pain  Let me know if agreeable to these 2 items

## 2023-03-07 ENCOUNTER — Ambulatory Visit (HOSPITAL_COMMUNITY)
Admission: RE | Admit: 2023-03-07 | Discharge: 2023-03-07 | Disposition: A | Discharge status: Home / Self Care | Payer: BC Managed Care – PPO | Source: Ambulatory Visit | Attending: Medical | Admitting: Medical

## 2023-03-07 DIAGNOSIS — R109 Unspecified abdominal pain: Secondary | ICD-10-CM | POA: Insufficient documentation

## 2023-03-08 NOTE — Progress Notes (Signed)
Call and see if he is still having side pain?   Does he drink alcohol now? If so, how much?   Prior heavy alcohol use?  Ultrasound shows possible changes in the liver, gall stones, but no swelling of gall bladder.  However depending upon symptoms and questions above, we may need to pursue other eval

## 2023-03-09 ENCOUNTER — Ambulatory Visit: Payer: BC Managed Care – PPO | Admitting: Cardiology

## 2023-03-09 ENCOUNTER — Encounter: Payer: Self-pay | Admitting: Cardiology

## 2023-03-09 ENCOUNTER — Other Ambulatory Visit: Payer: Self-pay | Admitting: Cardiology

## 2023-03-09 ENCOUNTER — Telehealth: Payer: Self-pay | Admitting: Medical

## 2023-03-09 VITALS — BP 149/97 | HR 67 | Resp 16 | Ht 69.0 in | Wt 283.0 lb

## 2023-03-09 DIAGNOSIS — I119 Hypertensive heart disease without heart failure: Secondary | ICD-10-CM

## 2023-03-09 DIAGNOSIS — R002 Palpitations: Secondary | ICD-10-CM

## 2023-03-09 MED ORDER — ATENOLOL 50 MG PO TABS: 50.0000 mg | ORAL_TABLET | Freq: Every evening | ORAL | 1 refills | Status: DC

## 2023-03-09 NOTE — Progress Notes (Signed)
Primary Physician/Referring:  Jac Canavan, PA-C  Patient ID: Adam Burns, male    DOB: 04/12/1991, 32 y.o.   MRN: [id number]  Chief Complaint  Patient presents with   Hypertension   Palpitations   Follow-up     HPI:    Adam Burns  is a 32 y.o. AA male with history of obesity, hypertension, and hypertensive heart disease presents for evaluation of palpitations. He started having palpitations, about 1 months ago, frequent palpitations in the form of skipped beats, mostly at rest. No other associated symptoms.   However, states his symptoms have completely subsided in the last 10 days.  Denies orthopnea, PND, leg edema.  Denies chest pain, dyspnea.  Past Medical History:  Diagnosis Date   Asthma    Cardiomegaly    Erectile dysfunction    urology consult 2018   Hypertension 09/2018   Obesity, unspecified    Past Surgical History:  Procedure Laterality Date   PENILE DEBRIDEMENT     ED issue   Family History  Problem Relation Age of Onset   Hypertension Mother    Diabetes Father    Other Father        died of brain infection   Heart disease Neg Hx    Stroke Neg Hx     Social History   Tobacco Use   Smoking status: Never   Smokeless tobacco: Never  Substance Use Topics   Alcohol use: No   Marital Status: Single   ROS  Review of Systems  Cardiovascular:  Negative for chest pain, dyspnea on exertion and leg swelling.   Objective  Blood pressure (!) 149/97, pulse 67, resp. rate 16, height  (1.753 m), weight 283 lb (128.4 kg), SpO2 95 %.     03/09/2023    3:16 PM 02/15/2023   10:19 AM 07/21/2022   11:38 AM  Vitals with BMI  Height     Weight 283 lbs 283 lbs 13 oz 277 lbs 13 oz  BMI 41.77    Systolic 149 120 657  Diastolic 97 82 70  Pulse 67 68 78      Physical Exam Constitutional:      Appearance: He is morbidly obese.  Neck:     Vascular: No carotid bruit or JVD.  Cardiovascular:     Rate and Rhythm: Normal rate and  regular rhythm.     Pulses: Intact distal pulses.     Heart sounds: Normal heart sounds. No murmur heard.    No gallop.  Pulmonary:     Effort: Pulmonary effort is normal.     Breath sounds: Normal breath sounds.  Abdominal:     General: Bowel sounds are normal.     Palpations: Abdomen is soft.  Musculoskeletal:     Right lower leg: No edema.     Left lower leg: No edema.    Laboratory examination:   Recent Labs    07/21/22 1343 02/15/23 1044  NA 141 140  K 3.8 4.0  CL 100 104  CO2 24 21  GLUCOSE 91 91  BUN 13 11  CREATININE 1.09 1.01  CALCIUM 10.5* 9.9       Latest Ref Rng & Units 02/15/2023   10:44 AM 07/21/2022    1:43 PM 12/14/2020    2:34 PM  CMP  Glucose 70 - 99 mg/dL 91  91  87   BUN 6 - 20 mg/dL Creatinine 0.76 - 1.27  mg/dL 9.60  4.54  0.98   Sodium 134 - 144 mmol/L 140  141  142   Potassium 3.5 - 5.2 mmol/L 4.0  3.8  4.1   Chloride 96 - 106 mmol/L 104  100  103   CO2 20 - 29 mmol/L Calcium 8.7 - 10.2 mg/dL 9.9  11.9  14.7   Total Protein 6.0 - 8.5 g/dL 7.3  7.9  7.5   Total Bilirubin 0.0 - 1.2 mg/dL 0.4  0.4  0.4   Alkaline Phos 44 - 121 IU/L 99  96  97   AST 0 - 40 IU/L ALT 0 - 44 IU/L 47  39  32       Latest Ref Rng & Units 02/15/2023   10:44 AM 12/14/2020    2:34 PM 12/13/2019    2:11 PM  CBC  WBC 3.4 - 10.8 x10E3/uL 5.2  5.3  5.1   Hemoglobin 13.0 - 17.7 g/dL 82.9  56.2  13.0   Hematocrit 37.5 - 51.0 % 43.6  42.5  41.3   Platelets 150 - 450 x10E3/uL 293  326  289     HEMOGLOBIN A1C Lab Results  Component Value Date   HGBA1C 5.3 07/21/2022   TSH Recent Labs    02/15/23 1044  TSH 1.570    External labs:   None   Radiology:   No results found.  Cardiac Studies:   PCV ECHOCARDIOGRAM COMPLETE 07/02/2021 Normal LV systolic function with visual EF 50-55%. Left ventricle cavity is normal in size. Moderate left ventricular hypertrophy. Normal global wall motion. Normal diastolic filling pattern,  normal LAP. Mild (Grade I) mitral regurgitation. Mild tricuspid regurgitation. No evidence of pulmonary hypertension. Compared to study 10/25/2018 no significant change.   EKG:   EKG 03/09/2023: Normal sinus rhythm at rate of 68 bpm, left axis deviation, left intrafascicular block.  Incomplete right bundle branch block.  LVH.  Compared to 12/30/2021, no significant change.   Allergies & Meds  No Known Allergies  Current Outpatient Medications:    atenolol (TENORMIN) 50 MG tablet, Take 1 tablet (50 mg total) by mouth every evening., Disp: 30 tablet, Rfl: 1   valsartan-hydrochlorothiazide (DIOVAN-HCT) 320-12.5 MG tablet, Take 1 tablet by mouth daily., Disp: 90 tablet, Rfl: 3   amLODipine (NORVASC) 5 MG tablet, TAKE 1 TABLET(5 MG) BY MOUTH DAILY (Patient not taking: Reported on 02/15/2023), Disp: 90 tablet, Rfl: 3   Assessment     ICD-10-CM   1. Hypertensive heart disease without heart failure  I11.9 EKG 12-Lead    atenolol (TENORMIN) 50 MG tablet    2. Palpitations  R00.2 atenolol (TENORMIN) 50 MG tablet    3. Class 3 severe obesity due to excess calories without serious comorbidity with body mass index (BMI) of 40.0 to 44.9 in adult  E66.01    Z68.41        Medications Discontinued During This Encounter  Medication Reason   clonazePAM (KLONOPIN) 1 MG tablet     Meds ordered this encounter  Medications   atenolol (TENORMIN) 50 MG tablet    Sig: Take 1 tablet (50 mg total) by mouth every evening.    Dispense:  30 tablet    Refill:  1   Recommendations:   Adam Burns is a 32 y.o. AA male with history of obesity, hypertension, and hypertensive heart disease presents for evaluation of palpitations.  1. Hypertensive heart disease without heart  failure Patient's BP is uncontrolled. In view of palpitations and hypertension will start atenolol. He can follow up with his PCP for hypertension management.   - EKG 12-Lead - atenolol (TENORMIN) 50 MG tablet; Take 1 tablet (50 mg  total) by mouth every evening.  Dispense: 30 tablet; Refill: 1  2. Palpitations Symptoms have resolved. Hopefully beta blockers will help with his symptoms - atenolol (TENORMIN) 50 MG tablet; Take 1 tablet (50 mg total) by mouth every evening.  Dispense: 30 tablet; Refill: 1  3. Class 3 severe obesity due to excess calories without serious comorbidity with body mass index (BMI) of 40.0 to 44.9 in adult [facility]) I have strictly warned him about hepatic steatosis and cirrhosis risk.   OV PRN     Yates Decamp, MD, [facility] 03/09/2023, 9:37 PM Office: [phone] Fax: [phone] Pager: [phone]

## 2023-03-09 NOTE — Telephone Encounter (Signed)
Spoke with pt. Details noted in lab results.

## 2023-03-09 NOTE — Telephone Encounter (Signed)
Terel would like a call, to go over some things from his last lab results.

## 2023-03-22 ENCOUNTER — Ambulatory Visit (INDEPENDENT_AMBULATORY_CARE_PROVIDER_SITE_OTHER): Payer: BC Managed Care – PPO | Admitting: Medical

## 2023-03-22 VITALS — BP 126/82 | HR 78 | Wt 276.0 lb

## 2023-03-22 DIAGNOSIS — F419 Anxiety disorder, unspecified: Secondary | ICD-10-CM

## 2023-03-22 DIAGNOSIS — Z13220 Encounter for screening for lipoid disorders: Secondary | ICD-10-CM

## 2023-03-22 DIAGNOSIS — R7989 Other specified abnormal findings of blood chemistry: Secondary | ICD-10-CM

## 2023-03-22 DIAGNOSIS — Z0000 Encounter for general adult medical examination without abnormal findings: Secondary | ICD-10-CM | POA: Diagnosis not present

## 2023-03-22 DIAGNOSIS — I10 Essential (primary) hypertension: Secondary | ICD-10-CM

## 2023-03-22 DIAGNOSIS — I517 Cardiomegaly: Secondary | ICD-10-CM

## 2023-03-22 DIAGNOSIS — Z6841 Body Mass Index (BMI) 40.0 and over, adult: Secondary | ICD-10-CM

## 2023-03-22 DIAGNOSIS — K760 Fatty (change of) liver, not elsewhere classified: Secondary | ICD-10-CM

## 2023-03-22 DIAGNOSIS — Z131 Encounter for screening for diabetes mellitus: Secondary | ICD-10-CM

## 2023-03-22 MED ORDER — ZEPBOUND 2.5 MG/0.5ML ~~LOC~~ SOAJ: 2.5000 mg | SUBCUTANEOUS | 0 refills | Status: DC

## 2023-03-22 NOTE — Progress Notes (Signed)
Subjective:   HPI  Adam Burns is a 32 y.o. 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  He wants to discussed a recent ultrasound he had for elevated liver test.  He has been making dietary changes to try to eat healthier and lose weight.  Reviewed their medical, surgical, family, social, medication, and allergy history and updated chart as appropriate.  No Known Allergies  Past Medical History:  Diagnosis Date   Asthma    Cardiomegaly    Erectile dysfunction    urology consult 2018   Hypertension 09/2018   Obesity, unspecified     Current Outpatient Medications on File Prior to Visit  Medication Sig Dispense Refill   valsartan-hydrochlorothiazide (DIOVAN-HCT) 320-12.5 MG tablet Take 1 tablet by mouth daily. 90 tablet 3   amLODipine (NORVASC) 5 MG tablet TAKE 1 TABLET(5 MG) BY MOUTH DAILY (Patient not taking: Reported on 03/22/2023) 90 tablet 3   atenolol (TENORMIN) 50 MG tablet TAKE 1 TABLET(50 MG) BY MOUTH EVERY EVENING (Patient not taking: Reported on 03/22/2023) 90 tablet 1   No current facility-administered medications on file prior to visit.      Current Outpatient Medications:    tirzepatide (ZEPBOUND) 2.5 MG/0.5ML Pen, Inject 2.5 mg into the skin once a week., Disp: 2 mL, Rfl: 0   valsartan-hydrochlorothiazide (DIOVAN-HCT) 320-12.5 MG tablet, Take 1 tablet by mouth daily., Disp: 90 tablet, Rfl: 3   amLODipine (NORVASC) 5 MG tablet, TAKE 1 TABLET(5 MG) BY MOUTH DAILY (Patient not taking: Reported on 03/22/2023), Disp: 90 tablet, Rfl: 3   atenolol (TENORMIN) 50 MG tablet, TAKE 1 TABLET(50 MG) BY MOUTH EVERY EVENING (Patient not taking: Reported on 03/22/2023), Disp: 90 tablet, Rfl: 1  Family History  Problem Relation Age of Onset   Hypertension Mother    Diabetes Father     Other Father        died of brain infection   Heart disease Neg Hx    Stroke Neg Hx     Past Surgical History:  Procedure Laterality Date   PENILE DEBRIDEMENT     ED issue   Review of Systems  Constitutional:  Negative for chills, fever, malaise/fatigue and weight loss.  HENT:  Negative for congestion, ear pain, hearing loss, sore throat and tinnitus.   Eyes:  Negative for blurred vision, pain and redness.  Respiratory:  Negative for cough, hemoptysis and shortness of breath.   Cardiovascular:  Negative for chest pain, palpitations, orthopnea, claudication and leg swelling.  Gastrointestinal:  Negative for abdominal pain, blood in stool, constipation, diarrhea, nausea and vomiting.  Genitourinary:  Negative for dysuria, flank pain, frequency, hematuria and urgency.  Musculoskeletal:  Negative for falls, joint pain and myalgias.  Skin:  Negative for itching and rash.  Neurological:  Negative for dizziness, tingling, speech change, weakness and headaches.  Endo/Heme/Allergies:  Negative for polydipsia. Does not bruise/bleed easily.  Psychiatric/Behavioral:  Negative for depression and memory loss. The patient is not nervous/anxious and does not have insomnia.       Objective:  BP 126/82   Pulse 78   Wt 276 lb (125.2 kg)   BMI 40.76 kg/m   General appearance: alert, no distress, WD/WN, African American male Skin: unremarkable HEENT: normocephalic, conjunctiva/corneas normal, sclerae anicteric, PERRLA, EOMi, nares patent,  no discharge or erythema, pharynx normal Oral cavity: MMM, tongue normal, teeth normal Neck: supple, no lymphadenopathy, no thyromegaly, no masses, normal ROM, no bruits Chest: non tender, normal shape and expansion Heart: RRR, normal S1, S2, no murmurs Lungs: CTA bilaterally, no wheezes, rhonchi, or rales Abdomen: +bs, soft, non tender, non distended, no masses, no hepatomegaly, no splenomegaly, no bruits Back: non tender, normal ROM, no  scoliosis Musculoskeletal: upper extremities non tender, no obvious deformity, normal ROM throughout, lower extremities non tender, no obvious deformity, normal ROM throughout Extremities: no edema, no cyanosis, no clubbing Pulses: 2+ symmetric, upper and lower extremities, normal cap refill Neurological: alert, oriented x 3, CN2-12 intact, strength normal upper extremities and lower extremities, sensation normal throughout, DTRs 2+ throughout, no cerebellar signs, gait normal Psychiatric: normal affect, behavior normal, pleasant  GU: normal male external genitalia,circumcised, nontender, no masses, no hernia, no lymphadenopathy Rectal: deferred    Assessment and Plan :   Encounter Diagnoses  Name Primary?   Encounter for health maintenance examination in adult Yes   Elevated LFTs    Essential hypertension, benign    Cardiomegaly    Anxiety    Screening for lipid disorders    BMI 40.0-44.9, adult (HCC)    Screening for diabetes mellitus    Hepatic steatosis     This visit was a preventative care visit, also known as wellness visit or routine physical.   Topics typically include healthy lifestyle, diet, exercise, preventative care, vaccinations, sick and well care, proper use of emergency dept and after hours care, as well as other concerns.     Separate significant issues discussed: Elevated liver test, recent ultrasound showed fatty liver deposits.  We discussed diagnosis of likely fatty liver disease.  No prior biopsy.  We discussed the significant need to lose weight with healthy diet and regular exercise.  Hypertension-right now he has only been taking valsartan HCT.  He recently saw cardiology.  He was supposed to be on atenolol and amlodipine, atenolol just recently added but currently he is only on valsartan HCT.  I advise he discuss medications with cardiology  BMI greater than 40-counseled on diet and exercise, need to lose weight.  Begin trial of Zepboun to help with  weight loss efforts.  Discussed risk and benefits and proper use of medication. .  General Recommendations: Continue to return yearly for your annual wellness and preventative care visits.  This gives [place] a chance to discuss healthy lifestyle, exercise, vaccinations, review your chart record, and perform screenings where appropriate.  I recommend you see your eye doctor yearly for routine vision care.  I recommend you see your dentist yearly for routine dental care including hygiene visits twice yearly.   Vaccination  Immunization History  Administered Date(s) Administered   Hepatitis A 10/17/2007, 04/17/2008   Hepatitis A, Adult 10/17/2007, 04/17/2008   Hepatitis B 08/14/2001, 09/18/2001, 01/29/2002   Influenza,inj,Quad PF,6+ Mos 12/14/2020, 07/21/2022   Meningococcal Conjugate 10/17/2007   PFIZER(Purple Top)SARS-COV-2 Vaccination 01/18/2020, 02/18/2020   Td 01/11/2005, 11/21/2011   Tdap 11/21/2011   Varicella 01/11/2005     Screening for cancer: Colon cancer screening: Age 80  Testicular cancer screening You should do a monthly self testicular exam if you are between 63-28 years old, and we typically do a testicular exam on the yearly physical for this same age group.   Prostate Cancer screening: The recommended prostate cancer screening test is a blood test called the prostate-specific antigen (PSA) test. PSA is a protein that is made  in the prostate. As you age, your prostate naturally produces more PSA. Abnormally high PSA levels may be caused by: Prostate cancer. An enlarged prostate that is not caused by cancer (benign prostatic hyperplasia, or BPH). This condition is very common in older men. A prostate gland infection (prostatitis) or urinary tract infection. Certain medicines such as male hormones (like testosterone) or other medicines that raise testosterone levels. A rectal exam may be done as part of prostate cancer screening to help provide information about the  size of your prostate gland. When a rectal exam is performed, it should be done after the PSA level is drawn to avoid any effect on the results.   Skin cancer screening: Check your skin regularly for new changes, growing lesions, or other lesions of concern Come in for evaluation if you have skin lesions of concern.   Lung cancer screening: If you have a greater than 20 pack year history of tobacco use, then you may qualify for lung cancer screening with a chest CT scan.   Please call your insurance company to inquire about coverage for this test.   Pancreatic cancer:  no current screening test is available or routinely recommended. (risk factors: smoking, overweight or obese, diabetes, chronic pancreatitis, work exposure - dry cleaning, metal working, 32yo>, M>F, Tree surgeon, family hx/o, hereditary breast, ovarian, melanoma, lynch, peutz-jeghers).  Symptoms: jaundice, dark urine, light color or greasy stools, itchy skin, belly or back pain, weight loss, poor appetite, nause, vomiting, liver enlargement, DVT/blood clots.   We currently don't have screenings for other cancers besides breast, cervical, colon, and lung cancers.  If you have a strong family history of cancer or have other cancer screening concerns, please let me know.  Genetic testing referral is an option for individuals with high cancer risk in the family.  There are some other cancer screenings in development currently.   Bone health: Get at least 150 minutes of aerobic exercise weekly Get weight bearing exercise at least once weekly Bone density test:  A bone density test is an imaging test that uses a type of X-ray to measure the amount of calcium and other minerals in your bones. The test may be used to diagnose or screen you for a condition that causes weak or thin bones (osteoporosis), predict your risk for a broken bone (fracture), or determine how well your osteoporosis treatment is working. The bone density test  is recommended for females 65 and older, or females or males <65 if certain risk factors such as thyroid disease, long term use of steroids such as for asthma or rheumatological issues, vitamin D deficiency, estrogen deficiency, family history of osteoporosis, self or family history of fragility fracture in first degree relative.    Heart health: Get at least 150 minutes of aerobic exercise weekly Limit alcohol It is important to maintain a healthy blood pressure and healthy cholesterol numbers  Heart disease screening: Screening for heart disease includes screening for blood pressure, fasting lipids, glucose/diabetes screening, BMI height to weight ratio, reviewed of smoking status, physical activity, and diet.    Goals include blood pressure 120/80 or less, maintaining a healthy lipid/cholesterol profile, preventing diabetes or keeping diabetes numbers under good control, not smoking or using tobacco products, exercising most days per week or at least 150 minutes per week of exercise, and eating healthy variety of fruits and vegetables, healthy oils, and avoiding unhealthy food choices like fried food, fast food, high sugar and high cholesterol foods.    Other tests  may possibly include EKG test, CT coronary calcium score, echocardiogram, exercise treadmill stress test.     Vascular disease screening: For higher risk individuals including smokers, diabetics, patients with known heart disease or high blood pressure, kidney disease, and others, screening for vascular disease or atherosclerosis of the arteries is available.  Examples may include carotid ultrasound, abdominal aortic ultrasound, ABI blood flow screening in the legs, thoracic aorta screening.   Medical care options: I recommend you continue to seek care here first for routine care.  We try really hard to have available appointments Monday through Friday daytime hours for sick visits, acute visits, and physicals.  Urgent care should  be used for after hours and weekends for significant issues that cannot wait till the next day.  The emergency department should be used for significant potentially life-threatening emergencies.  The emergency department is expensive, can often have long wait times for less significant concerns, so try to utilize primary care, urgent care, or telemedicine when possible to avoid unnecessary trips to the emergency department.  Virtual visits and telemedicine have been introduced since the pandemic started in 2020, and can be convenient ways to receive medical care.  We offer virtual appointments as well to assist you in a variety of options to seek medical care.   Legal  Take the time to do a last will and testament, Advanced Directives including Health Care Power of Attorney and Living Will documents.  Don't leave your family with burdens that can be handled ahead of time.   Advanced Directives: I recommend you consider completing a Health Care Power of Attorney and Living Will.   These documents respect your wishes and help alleviate burdens on your loved ones if you were to become terminally ill or be in a position to need those documents enforced.    You can complete Advanced Directives yourself, have them notarized, then have copies made for our office, for you and for anybody you feel should have them in safe keeping.  Or, you can have an attorney prepare these documents.   If you haven't updated your Last Will and Testament in a while, it may be worthwhile having an attorney prepare these documents together and save on some costs.       Spiritual and Emotional Health Keeping a healthy spiritual life can help you better manage your physical health. Your spiritual life can help you to cope with any issues that may arise with your physical health.  Balance can keep [place] healthy and help [place] to recover.  If you are struggling with your spiritual health there are questions that you may want to ask  yourself:  What makes me feel most complete? When do I feel most connected to the rest of the world? Where do I find the most inner strength? What am I doing when I feel whole?  Helpful tips: Being in nature. Some people feel very connected and at peace when they are walking outdoors or are outside. Helping others. Some feel the largest sense of wellbeing when they are of service to others. Being of service can take on many forms. It can be doing volunteer work, being kind to strangers, or offering a hand to a friend in need. Gratitude. Some people find they feel the most connected when they remain grateful. They may make lists of all the things they are grateful for or say a thank you out loud for all they have.    Emotional Health Are you in tune  with your emotional health?  Check out this link: http://www.marquez-love.com/    Financial Health Make sure you use a budget for your personal finances Make sure you are insured against risks (health insurance, life insurance, auto insurance, etc) Save more, spend less Set financial goals If you need help in this area, good resources include counseling through [employer] or other community resources, have a meeting with a Social research officer, government, and a good resource is the [employer] was seen today for fasting cpe.  Diagnoses and all orders for this visit:  Encounter for health maintenance examination in adult -     Lipid panel -     Hemoglobin A1c -     Hepatitis B surface antigen -     Hepatitis C antibody -     Iron  Elevated LFTs -     Hepatitis B surface antigen -     Hepatitis C antibody -     Iron  Essential hypertension, benign  Cardiomegaly  Anxiety  Screening for lipid disorders  BMI 40.0-44.9, adult (HCC)  Screening for diabetes mellitus -     Hemoglobin A1c  Hepatic steatosis  Other orders -     tirzepatide (ZEPBOUND) 2.5 MG/0.5ML Pen; Inject 2.5 mg into the skin  once a week.   Follow-up pending labs, yearly for physical

## 2023-03-23 LAB — LIPID PANEL
Chol/HDL Ratio: 3.1 ratio (ref 0.0–5.0)
Cholesterol, Total: 137 mg/dL (ref 100–199)
HDL: 44 mg/dL (ref >39)
LDL Chol Calc (NIH): 76 mg/dL (ref 0–99)
Triglycerides: 88 mg/dL (ref 0–149)
VLDL Cholesterol Cal: 17 mg/dL (ref 5–40)

## 2023-03-23 LAB — HEMOGLOBIN A1C
Est. average glucose Bld gHb Est-mCnc: 103 mg/dL
Hgb A1c MFr Bld: 5.2 % (ref 4.8–5.6)

## 2023-03-23 LAB — HEPATITIS C ANTIBODY: Hep C Virus Ab: NONREACTIVE

## 2023-03-23 LAB — HEPATITIS B SURFACE ANTIGEN: Hepatitis B Surface Ag: NEGATIVE

## 2023-03-23 LAB — IRON: Iron: 114 ug/dL (ref 38–169)

## 2023-03-23 NOTE — Progress Notes (Signed)
Cholesterol labs look good, diabetes marker 5.2 normal, negative hepatitis B, negative hepatitis C, iron normal  Begin trial of weekly injectable Zepbound if covered by insurance  Follow-up in 6 to 8 weeks after starting this medication

## 2023-03-30 ENCOUNTER — Telehealth: Payer: Self-pay | Admitting: Medical

## 2023-03-30 NOTE — Telephone Encounter (Signed)
Pt called and states he can't find the zepbound in stock and says you would change to another prescription?

## 2023-03-31 MED ORDER — ZEPBOUND 2.5 MG/0.5ML ~~LOC~~ SOAJ: 2.5000 mg | SUBCUTANEOUS | 0 refills | Status: DC

## 2023-03-31 NOTE — Telephone Encounter (Signed)
Sent to [facility]

## 2024-01-01 ENCOUNTER — Other Ambulatory Visit: Payer: Self-pay | Admitting: Medical

## 2024-03-26 ENCOUNTER — Encounter: Payer: BC Managed Care – PPO | Admitting: Medical

## 2024-04-07 ENCOUNTER — Other Ambulatory Visit: Payer: Self-pay | Admitting: Medical

## 2024-04-09 NOTE — Telephone Encounter (Signed)
 Has an appt in June 2025

## 2024-05-08 ENCOUNTER — Ambulatory Visit: Admitting: Medical

## 2024-05-08 ENCOUNTER — Encounter: Payer: Self-pay | Admitting: Medical

## 2024-05-08 VITALS — BP 124/82 | HR 78 | Ht 69.0 in | Wt 279.2 lb

## 2024-05-08 DIAGNOSIS — K760 Fatty (change of) liver, not elsewhere classified: Secondary | ICD-10-CM

## 2024-05-08 DIAGNOSIS — Z0000 Encounter for general adult medical examination without abnormal findings: Secondary | ICD-10-CM | POA: Diagnosis not present

## 2024-05-08 DIAGNOSIS — Z131 Encounter for screening for diabetes mellitus: Secondary | ICD-10-CM

## 2024-05-08 DIAGNOSIS — I10 Essential (primary) hypertension: Secondary | ICD-10-CM | POA: Diagnosis not present

## 2024-05-08 DIAGNOSIS — Z23 Encounter for immunization: Secondary | ICD-10-CM

## 2024-05-08 LAB — LIPID PANEL

## 2024-05-08 MED ORDER — VALSARTAN-HYDROCHLOROTHIAZIDE 320-12.5 MG PO TABS: 1.0000 | ORAL_TABLET | Freq: Every day | ORAL | 3 refills | Status: AC

## 2024-05-08 NOTE — Addendum Note (Signed)
 Addended by: EFRAIN, Pau Banh D on: 05/08/2024 03:52 PM   Modules accepted: Orders

## 2024-05-08 NOTE — Progress Notes (Signed)
 Subjective:   HPI  Adam Burns is a 33 y.o. male who presents for Chief Complaint  Patient presents with   Annual Exam    CPE, fasting labs,     Patient Care Team: Ilina Xu, Alm RAMAN, PA-C as PCP - General (Family Medicine)   Concerns: Here for physical.  Doing fine on current BP medicaiton  Reviewed their medical, surgical, family, social, medication, and allergy history and updated chart as appropriate.  No Known Allergies  Past Medical History:  Diagnosis Date   Asthma    Cardiomegaly    Erectile dysfunction    urology consult 2018   Hypertension 09/2018   Obesity, unspecified     No current outpatient medications on file prior to visit.   No current facility-administered medications on file prior to visit.      Current Outpatient Medications:    valsartan -hydrochlorothiazide  (DIOVAN -HCT) 320-12.5 MG tablet, Take 1 tablet by mouth daily., Disp: 90 tablet, Rfl: 3  Family History  Problem Relation Age of Onset   Hypertension Mother    Diabetes Father    Other Father        died of brain infection   Other Brother        LHON of eye   Heart disease Neg Hx    Stroke Neg Hx     Past Surgical History:  Procedure Laterality Date   PENILE DEBRIDEMENT     ED issue   Review of Systems  Constitutional:  Negative for chills, fever, malaise/fatigue and weight loss.  HENT:  Negative for congestion, ear pain, hearing loss, sore throat and tinnitus.   Eyes:  Negative for blurred vision, pain and redness.  Respiratory:  Negative for cough, hemoptysis and shortness of breath.   Cardiovascular:  Negative for chest pain, palpitations, orthopnea, claudication and leg swelling.  Gastrointestinal:  Negative for abdominal pain, blood in stool, constipation, diarrhea, nausea and vomiting.  Genitourinary:  Negative for dysuria, flank pain, frequency, hematuria and urgency.  Musculoskeletal:  Negative for falls, joint pain and myalgias.  Skin:  Negative for itching and  rash.  Neurological:  Negative for dizziness, tingling, speech change, weakness and headaches.  Endo/Heme/Allergies:  Negative for polydipsia. Does not bruise/bleed easily.  Psychiatric/Behavioral:  Negative for depression and memory loss. The patient is not nervous/anxious and does not have insomnia.       Objective:  BP 124/82   Pulse 78   Ht 5' 9 (1.753 m)   Wt 279 lb 3.2 oz (126.6 kg)   BMI 41.23 kg/m   Wt Readings from Last 3 Encounters:  05/08/24 279 lb 3.2 oz (126.6 kg)  03/22/23 276 lb (125.2 kg)  03/09/23 283 lb (128.4 kg)   BP Readings from Last 3 Encounters:  05/08/24 124/82  03/22/23 126/82  03/09/23 (!) 149/97    General appearance: alert, no distress, WD/WN, African American male Skin: unremarkable HEENT: normocephalic, conjunctiva/corneas normal, sclerae anicteric, PERRLA, EOMi, nares patent, no discharge or erythema, pharynx normal Oral cavity: MMM, tongue normal, teeth normal Neck: supple, no lymphadenopathy, no thyromegaly, no masses, normal ROM, no bruits Chest: non tender, normal shape and expansion Heart: RRR, normal S1, S2, no murmurs Lungs: CTA bilaterally, no wheezes, rhonchi, or rales Abdomen: +bs, soft, non tender, non distended, no masses, no hepatomegaly, no splenomegaly, no bruits Back: non tender, normal ROM, no scoliosis Musculoskeletal: upper extremities non tender, no obvious deformity, normal ROM throughout, lower extremities non tender, no obvious deformity, normal ROM throughout Extremities: no edema, no cyanosis,  no clubbing Pulses: 2+ symmetric, upper and lower extremities, normal cap refill Neurological: alert, oriented x 3, CN2-12 intact, strength normal upper extremities and lower extremities, sensation normal throughout, DTRs 2+ throughout, no cerebellar signs, gait normal Psychiatric: normal affect, behavior normal, pleasant  GU: normal male external genitalia,circumcised, nontender, no masses, no hernia, no lymphadenopathy Rectal:  deferred    Assessment and Plan :   Encounter Diagnoses  Name Primary?   Encounter for health maintenance examination in adult Yes   Essential hypertension, benign    Hepatic steatosis    Screening for diabetes mellitus    Need for Tdap vaccination      This visit was a preventative care visit, also known as wellness visit or routine physical.   Topics typically include healthy lifestyle, diet, exercise, preventative care, vaccinations, sick and well care, proper use of emergency dept and after hours care, as well as other concerns.     Separate significant issues discussed: Hypertension-continue current therapy Valsartan  HCT 320/12.5mg  daily.  Cardiomegaly noted on 2022 echo/LVH.   BMI greater than 40-counseled on diet and exercise  .  General Recommendations: Continue to return yearly for your annual wellness and preventative care visits.  This gives us  a chance to discuss healthy lifestyle, exercise, vaccinations, review your chart record, and perform screenings where appropriate.  I recommend you see your eye doctor yearly for routine vision care.  I recommend you see your dentist yearly for routine dental care including hygiene visits twice yearly.   Vaccination  Immunization History  Administered Date(s) Administered   Hepatitis A 10/17/2007, 04/17/2008   Hepatitis A, Adult 10/17/2007, 04/17/2008   Hepatitis B 08/14/2001, 09/18/2001, 01/29/2002   Influenza,inj,Quad PF,6+ Mos 12/14/2020, 07/21/2022   Meningococcal Conjugate 10/17/2007   PFIZER(Purple Top)SARS-COV-2 Vaccination 01/18/2020, 02/18/2020   Td 01/11/2005, 11/21/2011   Tdap 11/21/2011   Varicella 01/11/2005    Counseled on the Tdap (tetanus, diptheria, and acellular pertussis) vaccine.  Vaccine information sheet given. Tdap vaccine given after consent obtained.   Screening for cancer: Colon cancer screening: Age 12  Testicular cancer screening You should do a monthly self testicular exam if  you are between 22-44 years old, and we typically do a testicular exam on the yearly physical for this same age group.   Prostate Cancer screening: The recommended prostate cancer screening test is a blood test called the prostate-specific antigen (PSA) test. PSA is a protein that is made in the prostate. As you age, your prostate naturally produces more PSA. Abnormally high PSA levels may be caused by: Prostate cancer. An enlarged prostate that is not caused by cancer (benign prostatic hyperplasia, or BPH). This condition is very common in older men. A prostate gland infection (prostatitis) or urinary tract infection. Certain medicines such as male hormones (like testosterone) or other medicines that raise testosterone levels. A rectal exam may be done as part of prostate cancer screening to help provide information about the size of your prostate gland. When a rectal exam is performed, it should be done after the PSA level is drawn to avoid any effect on the results.   Skin cancer screening: Check your skin regularly for new changes, growing lesions, or other lesions of concern Come in for evaluation if you have skin lesions of concern.   Lung cancer screening: If you have a greater than 20 pack year history of tobacco use, then you may qualify for lung cancer screening with a chest CT scan.   Please call your insurance company to inquire  about coverage for this test.   Pancreatic cancer:  no current screening test is available or routinely recommended. (risk factors: smoking, overweight or obese, diabetes, chronic pancreatitis, work exposure - dry cleaning, metal working, 34yo>, M>F, Tree surgeon, family hx/o, hereditary breast, ovarian, melanoma, lynch, peutz-jeghers).  Symptoms: jaundice, dark urine, light color or greasy stools, itchy skin, belly or back pain, weight loss, poor appetite, nausea, vomiting, liver enlargement, DVT/blood clots.   We currently don't have screenings for  other cancers besides breast, cervical, colon, and lung cancers.  If you have a strong family history of cancer or have other cancer screening concerns, please let me know.  Genetic testing referral is an option for individuals with high cancer risk in the family.  There are some other cancer screenings in development currently.   Bone health: Get at least 150 minutes of aerobic exercise weekly Get weight bearing exercise at least once weekly Bone density test:  A bone density test is an imaging test that uses a type of X-ray to measure the amount of calcium and other minerals in your bones. The test may be used to diagnose or screen you for a condition that causes weak or thin bones (osteoporosis), predict your risk for a broken bone (fracture), or determine how well your osteoporosis treatment is working. The bone density test is recommended for females 65 and older, or females or males <65 if certain risk factors such as thyroid  disease, long term use of steroids such as for asthma or rheumatological issues, vitamin D deficiency, estrogen deficiency, family history of osteoporosis, self or family history of fragility fracture in first degree relative.    Heart health: Get at least 150 minutes of aerobic exercise weekly Limit alcohol It is important to maintain a healthy blood pressure and healthy cholesterol numbers  Heart disease screening: Screening for heart disease includes screening for blood pressure, fasting lipids, glucose/diabetes screening, BMI height to weight ratio, reviewed of smoking status, physical activity, and diet.    Goals include blood pressure 120/80 or less, maintaining a healthy lipid/cholesterol profile, preventing diabetes or keeping diabetes numbers under good control, not smoking or using tobacco products, exercising most days per week or at least 150 minutes per week of exercise, and eating healthy variety of fruits and vegetables, healthy oils, and avoiding  unhealthy food choices like fried food, fast food, high sugar and high cholesterol foods.    Other tests may possibly include EKG test, CT coronary calcium score, echocardiogram, exercise treadmill stress test.      Medical care options: I recommend you continue to seek care here first for routine care.  We try really hard to have available appointments Monday through Friday daytime hours for sick visits, acute visits, and physicals.  Urgent care should be used for after hours and weekends for significant issues that cannot wait till the next day.  The emergency department should be used for significant potentially life-threatening emergencies.  The emergency department is expensive, can often have long wait times for less significant concerns, so try to utilize primary care, urgent care, or telemedicine when possible to avoid unnecessary trips to the emergency department.  Virtual visits and telemedicine have been introduced since the pandemic started in 2020, and can be convenient ways to receive medical care.  We offer virtual appointments as well to assist you in a variety of options to seek medical care.    Bennet was seen today for annual exam.  Diagnoses and all orders for this visit:  Encounter for health maintenance examination in adult -     CBC -     Comprehensive metabolic panel with GFR -     Lipid panel -     Hemoglobin A1c -     TSH  Essential hypertension, benign  Hepatic steatosis -     Comprehensive metabolic panel with GFR -     Lipid panel  Screening for diabetes mellitus -     Hemoglobin A1c  Need for Tdap vaccination  Other orders -     valsartan -hydrochlorothiazide  (DIOVAN -HCT) 320-12.5 MG tablet; Take 1 tablet by mouth daily.    Follow-up pending labs, yearly for physical

## 2024-05-09 ENCOUNTER — Other Ambulatory Visit: Payer: Self-pay | Admitting: Medical

## 2024-05-09 ENCOUNTER — Ambulatory Visit: Payer: Self-pay | Admitting: Medical

## 2024-05-09 LAB — COMPREHENSIVE METABOLIC PANEL WITH GFR
ALT: 70 IU/L — ABNORMAL HIGH (ref 0–44)
AST: 33 IU/L (ref 0–40)
Albumin: 4.6 g/dL (ref 4.1–5.1)
Alkaline Phosphatase: 109 IU/L (ref 44–121)
BUN/Creatinine Ratio: 9 (ref 9–20)
BUN: 9 mg/dL (ref 6–20)
Bilirubin Total: 0.5 mg/dL (ref 0.0–1.2)
CO2: 21 mmol/L (ref 20–29)
Calcium: 9.9 mg/dL (ref 8.7–10.2)
Chloride: 102 mmol/L (ref 96–106)
Creatinine, Ser: 1.03 mg/dL (ref 0.76–1.27)
Globulin, Total: 2.9 g/dL (ref 1.5–4.5)
Glucose: 86 mg/dL (ref 70–99)
Potassium: 3.8 mmol/L (ref 3.5–5.2)
Sodium: 140 mmol/L (ref 134–144)
Total Protein: 7.5 g/dL (ref 6.0–8.5)
eGFR: 98 mL/min/{1.73_m2} (ref >59)

## 2024-05-09 LAB — CBC
Hematocrit: 42.9 % (ref 37.5–51.0)
Hemoglobin: 14.7 g/dL (ref 13.0–17.7)
MCH: 32 pg (ref 26.6–33.0)
MCHC: 34.3 g/dL (ref 31.5–35.7)
MCV: 93 fL (ref 79–97)
Platelets: 278 10*3/uL (ref 150–450)
RBC: 4.6 x10E6/uL (ref 4.14–5.80)
RDW: 12.9 % (ref 11.6–15.4)
WBC: 5.3 10*3/uL (ref 3.4–10.8)

## 2024-05-09 LAB — LIPID PANEL
Cholesterol, Total: 150 mg/dL (ref 100–199)
HDL: 43 mg/dL (ref >39)
LDL CALC COMMENT:: 3.5 ratio (ref 0.0–5.0)
LDL Chol Calc (NIH): 87 mg/dL (ref 0–99)
Triglycerides: 111 mg/dL (ref 0–149)
VLDL Cholesterol Cal: 20 mg/dL (ref 5–40)

## 2024-05-09 LAB — HEMOGLOBIN A1C
Est. average glucose Bld gHb Est-mCnc: 105 mg/dL
Hgb A1c MFr Bld: 5.3 % (ref 4.8–5.6)

## 2024-05-09 LAB — TSH: TSH: 1.34 u[IU]/mL (ref 0.450–4.500)

## 2024-05-09 MED ORDER — OZEMPIC (0.25 OR 0.5 MG/DOSE) 2 MG/1.5ML ~~LOC~~ SOPN: 0.2500 mg | PEN_INJECTOR | SUBCUTANEOUS | 0 refills | Status: DC

## 2024-05-09 NOTE — Progress Notes (Signed)
 Labs show normal kidney, blood sugar and electrolytes, normal blood counts, cholesterol looks good, diabetes marker normal at 5.3%, thyroid  normal.  Liver test still slightly abnormal likely due to fatty liver disease  I sent Ozempic  to your pharmacy, weekly injection.  I am going to see how your insurance responds.  I am prescribing this on the basis of elevated liver test, fatty liver disease and BMI greater than 30.  Lets see if your insurance will agree to this to help reduce the overall complications of elevated liver test and fatty liver.  Typically what happens is the insurance will decline the medication which will prompt a file authorization.  We have a prior authorization team that we will have to work through the paperwork to try to get this covered.  In general exercise most days per week.  Eat a healthy low-fat low sugar diet.  Lets work to lose weight through healthy eating habits and regular exercise

## 2024-05-13 ENCOUNTER — Telehealth: Payer: Self-pay

## 2024-05-13 ENCOUNTER — Other Ambulatory Visit (HOSPITAL_COMMUNITY): Payer: Self-pay

## 2024-05-13 NOTE — Telephone Encounter (Signed)
 Pharmacy Patient Advocate Encounter   Received notification from Physician's Office that prior authorization for Ozempic  (0.25 or 0.5 MG/DOSE) 2MG /3ML pen-injectors is required/requested.   Insurance verification completed.   The patient is insured through [facility] .   Per test claim: PA required; PA submitted to above mentioned insurance via CoverMyMeds Key/confirmation #/EOC Ozempic  (0.25 or 0.5 MG/DOSE) 2MG /3ML pen-injectors    Status is pending

## 2024-05-14 ENCOUNTER — Other Ambulatory Visit: Payer: Self-pay | Admitting: Medical

## 2024-05-14 MED ORDER — WEGOVY 0.25 MG/0.5ML ~~LOC~~ SOAJ: 0.2500 mg | SUBCUTANEOUS | 0 refills | Status: AC

## 2024-05-14 MED ORDER — WEGOVY 0.5 MG/0.5ML ~~LOC~~ SOAJ: 0.5000 mg | SUBCUTANEOUS | 0 refills | Status: AC

## 2024-05-14 NOTE — Telephone Encounter (Signed)
 Pharmacy Patient Advocate Encounter  Received notification from [facility] that Prior Authorization for OZEMPIC  has been DENIED.  Full denial letter will be uploaded to the media tab. See denial reason below.    Please note all listed diagnosis were listed during this P/A.  However Insurance will not covered un;ess the pt has Type 2 diabetes      PA #/Case ID/Reference #: (Key: [id number])

## 2024-05-15 ENCOUNTER — Other Ambulatory Visit: Payer: Self-pay | Admitting: Internal Medicine

## 2024-05-15 NOTE — Telephone Encounter (Signed)
 Pt was notified. I have discontinued Ozempic  off his med list since not covered

## 2024-05-29 ENCOUNTER — Other Ambulatory Visit: Payer: Self-pay | Admitting: Medical

## 2024-05-29 NOTE — Telephone Encounter (Signed)
   This was already refilled for a year

## 2024-06-14 NOTE — Progress Notes (Signed)
 Cardiology Transfer Consult Note  Adam Burns  [id number] 24-Jun-1991 06/14/24  Brief History: 33yoM w/hx HTN and obesity who presented for lightheadedness and vomiting. Did have one episode of chest pain earlier in the week. Came in since he was feeling unwell and was not taking his blood pressure medications. Troponin 64 -> 82. SBP initially 160s.  Also with transaminitis. Transferring hospital does not have TTE service over weekend.   Recommended medicine as primary service with cards consult as necessary.   Rosalynn Punter, MD PhD Cardiovascular Medicine Fellow 06/14/24

## 2024-06-15 NOTE — ED Provider Notes (Signed)
 ED Progress Note  2:55 AM-patient is signed out to me by PA Kopel with plan for transfer to the hospitalist service at [facility] for the patient having poorly controlled hypertension in the setting of prolonged medication noncompliance, and reported a 10-minute episode of chest pain that occurred 3 days ago, none since.  Patient had stable labs, stable/chronic transaminitis, negative UDS, for trace elevated but stable troponin, vital signs stable in the ER.  Suspect trace elevation in troponin is related to chronic poorly controlled hypertension and is more likely to represent trace troponinemia rather than acute coronary syndrome as the patient is currently asymptomatic.  In certain left I have reviewed the ER PA as well as the hospitalist PA note.   I am informed by RN that patient is now requesting to leave AMA.  Apparently the patient's mother arrived to the ER visibly intoxicated and when she left driving her own vehicle, she was apparently stopped and arrested for DUI, after which the patient elected to leave AMA.  Review of hospitalist PA note documents that the patient had already requested outpatient management rather than admission and cardiologist at [facility] did not have any further emergent treatment recommendations given that the patient has been pain-free for the past several days.  RN did have patient signed the AMA form but he left the department before I had the opportunity to speak with him.   Notes: Prior to seeing the patient, the triage note and nursing notes were reviewed, and I agree, except for where my documentation differs.  Old records obtained, reviewed, and summarized in PM[facility] (unless unavailable). All laboratory values, imaging studies, and EKG's were personally reviewed by me (when ordered).    Evonnie Redder, MD Emergency Attending Physician

## 2024-06-17 ENCOUNTER — Emergency Department (HOSPITAL_COMMUNITY)

## 2024-06-17 ENCOUNTER — Encounter (HOSPITAL_COMMUNITY): Payer: Self-pay

## 2024-06-17 ENCOUNTER — Emergency Department (HOSPITAL_COMMUNITY)
Admission: EM | Admit: 2024-06-17 | Discharge: 2024-06-17 | Disposition: A | Discharge status: Home / Self Care | Attending: Emergency Medicine | Admitting: Emergency Medicine

## 2024-06-17 ENCOUNTER — Other Ambulatory Visit: Payer: Self-pay

## 2024-06-17 DIAGNOSIS — R42 Dizziness and giddiness: Secondary | ICD-10-CM | POA: Diagnosis present

## 2024-06-17 DIAGNOSIS — J45909 Unspecified asthma, uncomplicated: Secondary | ICD-10-CM | POA: Diagnosis not present

## 2024-06-17 DIAGNOSIS — Z79899 Other long term (current) drug therapy: Secondary | ICD-10-CM | POA: Insufficient documentation

## 2024-06-17 DIAGNOSIS — I10 Essential (primary) hypertension: Secondary | ICD-10-CM | POA: Insufficient documentation

## 2024-06-17 LAB — BASIC METABOLIC PANEL WITH GFR
Anion gap: 11 (ref 5–15)
BUN: 11 mg/dL (ref 6–20)
CO2: 23 mmol/L (ref 22–32)
Calcium: 10 mg/dL (ref 8.9–10.3)
Chloride: 104 mmol/L (ref 98–111)
Creatinine, Ser: 1.21 mg/dL (ref 0.61–1.24)
GFR, Estimated: >60 mL/min (ref >60)
Glucose, Bld: 98 mg/dL (ref 70–99)
Potassium: 3.5 mmol/L (ref 3.5–5.1)
Sodium: 138 mmol/L (ref 135–145)

## 2024-06-17 LAB — TROPONIN I (HIGH SENSITIVITY)
Troponin I (High Sensitivity): 15 ng/L (ref <18)
Troponin I (High Sensitivity): 16 ng/L (ref <18)

## 2024-06-17 LAB — CBC
HCT: 44.8 % (ref 39.0–52.0)
Hemoglobin: 15.9 g/dL (ref 13.0–17.0)
MCH: 31.8 pg (ref 26.0–34.0)
MCHC: 35.5 g/dL (ref 30.0–36.0)
MCV: 89.6 fL (ref 80.0–100.0)
Platelets: 287 K/uL (ref 150–400)
RBC: 5 MIL/uL (ref 4.22–5.81)
RDW: 12.3 % (ref 11.5–15.5)
WBC: 5.2 K/uL (ref 4.0–10.5)
nRBC: 0 % (ref 0.0–0.2)

## 2024-06-17 LAB — MAGNESIUM: Magnesium: 2 mg/dL (ref 1.7–2.4)

## 2024-06-17 MED ORDER — LACTATED RINGERS IV BOLUS: 1000.0000 mL | Freq: Once | INTRAVENOUS | Status: DC

## 2024-06-17 MED ORDER — IOHEXOL 350 MG/ML SOLN
75.0000 mL | Freq: Once | INTRAVENOUS | Status: AC | PRN
  Administered 2024-06-17: 75 mL via INTRAVENOUS

## 2024-06-17 NOTE — ED Triage Notes (Signed)
 Pt endorses mild lightheadedness currently, states he is feeling anxious; denies pain, denies sob

## 2024-06-17 NOTE — Discharge Instructions (Signed)
 Your test results today were reassuring.  Your heart enzymes today were normal.  Keep your cardiology appointment for later this month.  Continue your home blood pressure medications.  Return to the emergency department for any new or worsening symptoms of concern.

## 2024-06-17 NOTE — ED Triage Notes (Signed)
 Pt to er, pt states that Friday he was seen at [facility] and they did and echo and he was told that his enzyme was elevated and they wanted to admit him, states that he had a family emergency and had to go home.  Pt states that he was supposed to follow up with cardiology.  States that he is here for further work up, pt denies chest pain.

## 2024-06-17 NOTE — ED Provider Notes (Signed)
 [place] EMERGENCY DEPARTMENT AT [facility] Provider Note   CSN: [id number] Arrival date & time: 06/17/24  1350     Patient presents with: Dizziness   Adam Burns is a 33 y.o. male.    Dizziness Patient presents for recent abnormal lab work.  Medical history includes HTN, cardiomegaly, anxiety, GERD, asthma.  He has recently been working 3 separate jobs.  These jobs are not physically taxing.  He has been having long commute in between jobs.  3 days ago, he presented to [facility] for some mild lightheadedness.  At the time, he endorsed some prior chest pain that occurred a week ago.  Episode was short-lived.  It was nonexertional.  This was in the setting of running out of his home blood pressure medications.  He has since resumed taking his blood pressure medications.  3 days ago, his troponin was 82.  He declined admission.  Today, he states that his lightheadedness have improved.  He has not had any further episodes of chest pain.  He presents due to the abnormal lab work that he had 3 days ago.  He has previously seen cardiology but has been lost to follow-up for the past 1.5 years.  He did schedule appoint with them in 2 and a half weeks.     Prior to Admission medications   Medication Sig Start Date End Date Taking? Authorizing Provider  Semaglutide -Weight Management (WEGOVY ) 0.25 MG/0.5ML SOAJ Inject 0.25 mg into the skin once a week. 05/14/24   Tysinger, Alm RAMAN, PA-C  Semaglutide -Weight Management (WEGOVY ) 0.5 MG/0.5ML SOAJ Inject 0.5 mg into the skin once a week. 05/14/24   Tysinger, Alm RAMAN, PA-C  valsartan -hydrochlorothiazide  (DIOVAN -HCT) 320-12.5 MG tablet Take 1 tablet by mouth daily. 05/08/24   Tysinger, Alm RAMAN, PA-C    Allergies: Patient has no known allergies.    Review of Systems  Neurological:  Positive for light-headedness.  All other systems reviewed and are negative.   Updated Vital Signs BP 110/76   Pulse (!) 59   Temp 98 F (36.7 C)  (Oral)   Resp 17   Ht 5' 9 (1.753 m)   Wt 126.6 kg   SpO2 100%   BMI 41.22 kg/m   Physical Exam Vitals and nursing note reviewed.  Constitutional:      General: He is not in acute distress.    Appearance: Normal appearance. He is well-developed. He is not ill-appearing, toxic-appearing or diaphoretic.  HENT:     Head: Normocephalic and atraumatic.     Right Ear: External ear normal.     Left Ear: External ear normal.     Nose: Nose normal.  Eyes:     Extraocular Movements: Extraocular movements intact.     Conjunctiva/sclera: Conjunctivae normal.  Cardiovascular:     Rate and Rhythm: Normal rate and regular rhythm.  Pulmonary:     Effort: Pulmonary effort is normal. No respiratory distress.  Abdominal:     General: There is no distension.     Palpations: Abdomen is soft.  Musculoskeletal:        General: No swelling. Normal range of motion.     Cervical back: Normal range of motion and neck supple.  Skin:    General: Skin is warm and dry.     Coloration: Skin is not jaundiced or pale.  Neurological:     General: No focal deficit present.     Mental Status: He is alert and oriented to person, place, and time.  Psychiatric:        Mood and Affect: Mood normal.        Behavior: Behavior normal.     (all labs ordered are listed, but only abnormal results are displayed) Labs Reviewed  CBC  BASIC METABOLIC PANEL WITH GFR  MAGNESIUM  BRAIN NATRIURETIC PEPTIDE  TROPONIN I (HIGH SENSITIVITY)  TROPONIN I (HIGH SENSITIVITY)    EKG: EKG Interpretation Date/Time:  Monday June 17 2024 14:00:29 EDT Ventricular Rate:  78 PR Interval:  152 QRS Duration:  108 QT Interval:  398 QTC Calculation: 453 R Axis:   -70  Text Interpretation: Normal sinus rhythm Pulmonary disease pattern Left anterior fascicular block Abnormal ECG Confirmed by Melvenia Motto ([id number]) on 06/17/2024 4:25:05 PM  Radiology: CT Angio Chest PE W and/or Wo Contrast Result Date: 06/17/2024 EXAM: CTA of the  Chest with contrast for PE 06/17/2024 08:18:17 PM TECHNIQUE: CTA of the chest was performed after the administration of intravenous contrast. Multiplanar reformatted images are provided for review. MIP images are provided for review. Automated exposure control, iterative reconstruction, and/or weight based adjustment of the mA/kV was utilized to reduce the radiation dose to as low as reasonably achievable. COMPARISON: Radiograph 10/18/2007. CLINICAL HISTORY: Pulmonary embolism (PE) suspected, high prob. Elevated enzymes. FINDINGS: PULMONARY ARTERIES: Pulmonary arteries are adequately opacified for evaluation. No pulmonary embolism. Main pulmonary artery is normal in caliber. MEDIASTINUM: Cardiomegaly. There is no acute abnormality of the thoracic aorta. LYMPH NODES: No mediastinal, hilar or axillary lymphadenopathy. LUNGS AND PLEURA: Mosaic attenuation of the lungs compatible with air trapping which can be seen with small airway infection/inflammation. No focal consolidation or pulmonary edema. No pleural effusion or pneumothorax. UPPER ABDOMEN: Hepatic steatosis. SOFT TISSUES AND BONES: No acute bone or soft tissue abnormality. IMPRESSION: 1. No pulmonary embolism. 2. Mosaic attenuation of the lungs compatible with air trapping, which can be seen with small airway infection/inflammation. 3. Hepatic steatosis. 4. Cardiomegaly. Electronically signed by: Norman Gatlin MD 06/17/2024 08:27 PM EDT RP Workstation: [id number]     Procedures   Medications Ordered in the ED  lactated ringers  bolus 1,000 mL (1,000 mLs Intravenous Not Given 06/17/24 2020)  iohexol  (OMNIPAQUE ) 350 MG/ML injection 75 mL (75 mLs Intravenous Contrast Given 06/17/24 2011)                                    Medical Decision Making Amount and/or Complexity of Data Reviewed Labs: ordered. Radiology: ordered.  Risk Prescription drug management.   This patient presents to the ED for concern of lightheadedness, this involves an  extensive number of treatment options, and is a complaint that carries with it a high risk of complications and morbidity.  The differential diagnosis includes dehydration, anemia, metabolic derangements, PE, CHF   Co morbidities / Chronic conditions that complicate the patient evaluation  HTN, cardiomegaly, anxiety, GERD, asthma   Additional history obtained:  Additional history obtained from EMR External records from outside source obtained and reviewed including N/A   Lab Tests:  I Ordered, and personally interpreted labs.  The pertinent results include: Normal hemoglobin, no leukocytosis, normal kidney function, normal electrolytes, normal troponins x 2   Imaging Studies ordered:  I ordered imaging studies including CTA chest I independently visualized and interpreted imaging which showed no acute findings I agree with the radiologist interpretation   Cardiac Monitoring: / EKG:  The patient was maintained on a cardiac monitor.  I personally viewed and  interpreted the cardiac monitored which showed an underlying rhythm of: Sinus rhythm   Problem List / ED Course / Critical interventions / Medication management  Patient presenting for abnormal lab work that occurred 3 days ago.  He was seen at outside hospital for some recent lightheadedness.  His troponin at the time was 83.  This was in the setting of missed blood pressure medications.  Today, he reports improved symptoms but was concerned about his abnormal lab work and having to leave AMA previously.  Prior to being bedded in the ED, initial lab work was obtained.  Patient's troponin today is normal.  On exam, he is well-appearing.  He is asymptomatic.  He was kept on monitor and repeat troponin was ordered.  Patient also underwent CTA of chest which did not show any acute findings.  There was a radiology read of possible small airway inflammation/infection, however, patient has not had any recent infectious symptoms.  Patient  remained asymptomatic while in the ED.  He currently has cardiology follow-up scheduled in 2-1/2 weeks.  He was discharged in stable condition. I ordered medication including IV fluids for hydration  Social Determinants of Health:  Lives independently     Final diagnoses:  Lightheadedness    ED Discharge Orders     None          Melvenia Motto, MD 06/17/24 2052

## 2024-06-17 NOTE — ED Notes (Signed)
 Patient transported to CT

## 2024-07-04 NOTE — Progress Notes (Signed)
 Cardiology Office Note:    Date:  07/05/2024  ID:  Render, Marley 02-17-1991, MRN [id number] PCP: Bulah Alm GORMAN DEVONNA  [facility] Providers Cardiologist:  Gordy Bergamo, MD Cardiology APP:  Rana Lum CROME, NP       Patient Profile:       Chief Complaint: ED follow-up in 1 year follow-up History of Present Illness:  Adam Burns is a 33 y.o. male with visit-pertinent history of obesity, hypertension, hypertensive heart disease  Echocardiogram 07/02/2021 showed LVEF 50 to 55%, moderate LVH, no RWMA, normal diastolic parameters, mild mitral valve regurgitation, mild tricuspid valve regurgitation.  He was last seen in office on 03/09/2023 by Dr. Bergamo.  He had reported 10 days of palpitations.  His blood pressure was uncontrolled at 149/97.  He was started on atenolol  50 mg daily.  His palpitations had resolved and no further workup was indicated at that time.  Recently he had presented to [facility] ED on 06/14/2024 with complaints of chest pain.  The chest pain occurred 3 days prior to ED visit and it was reported to be a 10-minute episode.  He was chest pain-free during visit.  He had noted to have been out of his blood pressure medication for several weeks.  Blood pressure was noted to be 200s/100s.  He had elevated but stable troponins at 70 and 71 though suspected to be related to poorly controlled hypertension.  Ultimately he left the ED AMA.  He was seen once more in the ED at [facility] on 06/17/2024 with complaints of lightheadedness.  He has since resumed taking his blood pressure medications.  His blood pressure had stabilized and his troponins were normal.  CT angio chest was negative for PE.   Discussed the use of AI scribe software for clinical note transcription with the patient, who gave verbal consent to proceed.  History of Present Illness Adam Burns is a 33 year old male with hypertension who presents with recent episodes of chest pain and  lightheadedness.  He has hypertension, initially managed with atenolol , now controlled with valsartan  and hydrochlorothiazide . Home blood pressure readings are typically in the 120s to 130s systolic range.  He visited the emergency department twice in the past month. The first visit was due to lightheadedness and a blood pressure reading in the 200s, after discontinuing medication for a few weeks. He experienced chest pain a few days before this visit, but not on the day of the visit. The chest pain occurred once and has not recurred.  Since resuming his medication, he feels normal with no current chest pain, lightheadedness, syncope, orthopnea, PND, or shortness of breath.  He exercises regularly, running on the treadmill and using the elliptical daily. He is working on improving his diet and has been prescribed Wegovy  for weight loss but has not started it due to cost. He experienced weight gain following his father's death in 2017/08/15.   Review of systems:  Please see the history of present illness. All other systems are reviewed and otherwise negative.      Studies Reviewed:        Echocardiogram 07/02/2021 Normal LV systolic function with visual EF 50-55%. Left ventricle cavity  is normal in size. Moderate left ventricular hypertrophy. Normal global  wall motion. Normal diastolic filling pattern, normal LAP.  Mild (Grade I) mitral regurgitation.  Mild tricuspid regurgitation. No evidence of pulmonary hypertension.   Risk Assessment/Calculations:  Physical Exam:   VS:  BP 130/84 (BP Location: Left Arm, Patient Position: Sitting, Cuff Size: Large)   Pulse 79   Ht 5' 9 (1.753 m)   Wt 273 lb 12.8 oz (124.2 kg)   SpO2 100%   BMI 40.43 kg/m    Wt Readings from Last 3 Encounters:  07/05/24 273 lb 12.8 oz (124.2 kg)  06/17/24 279 lb 1.6 oz (126.6 kg)  05/08/24 279 lb 3.2 oz (126.6 kg)    GEN: Well nourished, well developed in no acute distress NECK: No JVD; No  carotid bruits CARDIAC: RRR, no murmurs, rubs, gallops RESPIRATORY:  Clear to auscultation without rales, wheezing or rhonchi  ABDOMEN: Soft, non-tender, non-distended EXTREMITIES:  No edema; No acute deformity      Assessment and Plan:  Hypertensive heart disease Echocardiogram 06/2021 with LVEF 50 to 55% and moderate LVH Blood pressure today is 130/84 Previous ED visits on 8/1 and 8/4 were in the setting of lightheadedness after prolonged medication noncompliance with blood pressure up to 200s/100s - Blood pressure today is much better controlled - He will begin monitoring BP at home and alert office for consistent BP > 130/80 - If BP remains above goal consider reintroducing amlodipine  - Continue valsartan -hydrochlorothiazide  320-12.5 mg daily - Continue with heart healthy dieting and weight loss - F/u with PCP  Chest pain Single episode approximately 1 month ago, lasting 10 minutes, that was non-exertional.  Troponins on 8/1 were 71 and 70.  Repeat troponins on 8/4 were normal at 15 and 16.  His elevated troponins occurred in the setting of hypertensive urgency - Today he is without any chest pains and denies any reoccurrence.  He is currently exercising daily on the treadmill and elliptical without any exertional symptoms.  He has remained adherent to current medication regimen.  There is no indication for further ischemic evaluation at this time - Maintain adequate blood pressure  Palpitations - Quiescent and no longer on beta-blocker therapy  Obesity BMI 40.43 - He plans to start Wegovy  - Recommend DASH diet (high in vegetables, fruits, low-fat dairy products, whole grains, poultry, fish, and nuts and low in sweets, sugar-sweetened beverages, and red meats), salt restriction and increase physical activity.       Dispo:  Return in about 6 months (around 01/05/2025).  Signed, Lum LITTIE Louis, NP

## 2024-07-05 ENCOUNTER — Ambulatory Visit: Payer: Self-pay | Attending: Emergency Medicine | Admitting: Emergency Medicine

## 2024-07-05 ENCOUNTER — Encounter: Payer: Self-pay | Admitting: Emergency Medicine

## 2024-07-05 VITALS — BP 130/84 | HR 79 | Ht 69.0 in | Wt 273.8 lb

## 2024-07-05 DIAGNOSIS — Z6841 Body Mass Index (BMI) 40.0 and over, adult: Secondary | ICD-10-CM

## 2024-07-05 DIAGNOSIS — R002 Palpitations: Secondary | ICD-10-CM | POA: Diagnosis not present

## 2024-07-05 DIAGNOSIS — E66813 Obesity, class 3: Secondary | ICD-10-CM

## 2024-07-05 DIAGNOSIS — I517 Cardiomegaly: Secondary | ICD-10-CM

## 2024-07-05 DIAGNOSIS — I119 Hypertensive heart disease without heart failure: Secondary | ICD-10-CM | POA: Diagnosis not present

## 2024-07-05 DIAGNOSIS — R079 Chest pain, unspecified: Secondary | ICD-10-CM | POA: Diagnosis not present

## 2024-07-05 NOTE — Patient Instructions (Signed)
 Medication Instructions:  NO CHANGES  Lab Work: NONE TO BE DONE TODAY.   Testing/Procedures: NONE  Follow-Up: At [facility], you and your health needs are our priority.  As part of our continuing mission to provide you with exceptional heart care, our providers are all part of one team.  This team includes your primary Cardiologist (physician) and Advanced Practice Providers or APPs (Physician Assistants and Nurse Practitioners) who all work together to provide you with the care you need, when you need it.  Your next appointment:   6 MONTHS  Provider:   MADISON FOUNTAIN, NP  We recommend signing up for the patient portal called MyChart.  Sign up information is provided on this After Visit Summary.  MyChart is used to connect with patients for Virtual Visits (Telemedicine).  Patients are able to view lab/test results, encounter notes, upcoming appointments, etc.  Non-urgent messages can be sent to your provider as well.   To learn more about what you can do with MyChart, go to [email or web address].   Other Instructions PLEASE TAKE AND LOG YOUR BLOOD PRESSURE AND HEART RATE DAILY AND SEND VIA MYCHART IN 2 WEEKS.

## 2025-05-14 ENCOUNTER — Encounter: Payer: Self-pay | Admitting: Medical
